# Patient Record
Sex: Female | Born: 1980 | Race: White | Hispanic: No | Smoking: Former smoker
Health system: Southern US, Community
[De-identification: ages and names within clinical notes are randomized; demographics above are authoritative.]

## PROBLEM LIST (undated history)

## (undated) DIAGNOSIS — J939 Pneumothorax, unspecified: Secondary | ICD-10-CM

## (undated) DIAGNOSIS — F32A Depression, unspecified: Secondary | ICD-10-CM

## (undated) DIAGNOSIS — F329 Major depressive disorder, single episode, unspecified: Secondary | ICD-10-CM

## (undated) DIAGNOSIS — E039 Hypothyroidism, unspecified: Secondary | ICD-10-CM

## (undated) DIAGNOSIS — N83209 Unspecified ovarian cyst, unspecified side: Secondary | ICD-10-CM

## (undated) DIAGNOSIS — Z9109 Other allergy status, other than to drugs and biological substances: Secondary | ICD-10-CM

## (undated) DIAGNOSIS — N946 Dysmenorrhea, unspecified: Secondary | ICD-10-CM

## (undated) DIAGNOSIS — K219 Gastro-esophageal reflux disease without esophagitis: Secondary | ICD-10-CM

## (undated) HISTORY — DX: Pneumothorax, unspecified: J93.9

## (undated) HISTORY — PX: TUBAL LIGATION: SHX77

## (undated) HISTORY — DX: Depression, unspecified: F32.A

## (undated) HISTORY — PX: OTHER SURGICAL HISTORY: SHX169

## (undated) HISTORY — DX: Major depressive disorder, single episode, unspecified: F32.9

## (undated) HISTORY — PX: CATARACT EXTRACTION: SUR2

## (undated) HISTORY — PX: CARDIAC VALVE SURGERY: SHX40

---

## 2015-10-08 ENCOUNTER — Emergency Department (HOSPITAL_COMMUNITY)
Admission: EM | Admit: 2015-10-08 | Discharge: 2015-10-08 | Disposition: A | Discharge status: Home / Self Care | Payer: Medicaid Other | Attending: Emergency Medicine | Admitting: Emergency Medicine

## 2015-10-08 ENCOUNTER — Emergency Department (HOSPITAL_COMMUNITY): Payer: Medicaid Other

## 2015-10-08 ENCOUNTER — Encounter (HOSPITAL_COMMUNITY): Payer: Self-pay | Admitting: Emergency Medicine

## 2015-10-08 DIAGNOSIS — Z87891 Personal history of nicotine dependence: Secondary | ICD-10-CM | POA: Diagnosis not present

## 2015-10-08 DIAGNOSIS — R1031 Right lower quadrant pain: Secondary | ICD-10-CM | POA: Diagnosis not present

## 2015-10-08 HISTORY — DX: Unspecified ovarian cyst, unspecified side: N83.209

## 2015-10-08 LAB — URINALYSIS, ROUTINE W REFLEX MICROSCOPIC
BILIRUBIN URINE: NEGATIVE
Glucose, UA: NEGATIVE mg/dL
HGB URINE DIPSTICK: NEGATIVE
Ketones, ur: NEGATIVE mg/dL
Nitrite: NEGATIVE
PH: 6 (ref 5.0–8.0)
Protein, ur: NEGATIVE mg/dL
SPECIFIC GRAVITY, URINE: 1.014 (ref 1.005–1.030)

## 2015-10-08 LAB — PREGNANCY, URINE: Preg Test, Ur: NEGATIVE

## 2015-10-08 LAB — WET PREP, GENITAL
Sperm: NONE SEEN
TRICH WET PREP: NONE SEEN
YEAST WET PREP: NONE SEEN

## 2015-10-08 LAB — URINE MICROSCOPIC-ADD ON

## 2015-10-08 MED ORDER — TRAMADOL HCL 50 MG PO TABS: 50.0000 mg | ORAL_TABLET | Freq: Four times a day (QID) | ORAL | Status: DC | PRN

## 2015-10-08 MED ORDER — ACETAMINOPHEN 500 MG PO TABS
1000.0000 mg | ORAL_TABLET | Freq: Once | ORAL | Status: AC
  Administered 2015-10-08: 1000 mg via ORAL
  Filled 2015-10-08: qty 2

## 2015-10-08 NOTE — ED Provider Notes (Signed)
CSN: [id number]     Arrival date & time 10/08/15  4098 History   First MD Initiated Contact with Patient 10/08/15 [phone]     Chief Complaint  Patient presents with  . Abdominal Pain    HPI  Patient patient presents with concern of right lower quadrant abdominal pain. Pain began possibly 2 weeks ago, has become worse the past few days. Pain is nonradiating, focally in the inguinal crease, sharp, moderate. There is also ongoing pain following intercourse, but no new bleeding, discharge. Patient denies fever, chills. No relief with OTC medication. Patient has a history of ovarian cyst, diagnosed one year ago. Patient recently moved to this area, has no gynecologist here. Patient states that she is otherwise well.   Past Medical History  Diagnosis Date  . Ovarian cyst    Past Surgical History  Procedure Laterality Date  . Leap procedure    . Right foot screws      No family history on file. Social History  Substance Use Topics  . Smoking status: Former Smoker    Quit date: 08/08/2015  . Smokeless tobacco: None  . Alcohol Use: Yes     Comment: occasionally   OB History    No data available     Review of Systems  Constitutional:       Per HPI, otherwise negative  HENT:       Per HPI, otherwise negative  Respiratory:       Per HPI, otherwise negative  Cardiovascular:       Per HPI, otherwise negative  Gastrointestinal: Positive for nausea. Negative for vomiting.  Endocrine:       Negative aside from HPI  Genitourinary:       No dysuria, +cervical pain, intercourse every day.  Musculoskeletal:       Per HPI, otherwise negative  Skin: Negative for color change.  Neurological: Negative for syncope.      Allergies  Ibuprofen  Home Medications   Prior to Admission medications   Medication Sig Start Date End Date Taking? Authorizing Provider  levothyroxine (SYNTHROID, LEVOTHROID) 50 MCG tablet Take 50 mcg by mouth daily before breakfast.   Yes Historical  Provider, MD  Multiple Vitamin (MULTIVITAMIN WITH MINERALS) TABS tablet Take 1 tablet by mouth daily.   Yes Historical Provider, MD   BP 123/82 mmHg  Pulse 70  Temp(Src) 98.6 F (37 C) (Oral)  Ht  (1.549 m)  Wt 125 lb (56.7 kg)  BMI 23.63 kg/m2  SpO2 100%  LMP 09/24/2015 (Approximate) Physical Exam  Constitutional: She is oriented to person, place, and time. She appears well-developed and well-nourished. No distress.  HENT:  Head: Normocephalic and atraumatic.  Eyes: Conjunctivae and EOM are normal.  Cardiovascular: Normal rate and regular rhythm.   Pulmonary/Chest: Effort normal and breath sounds normal. No stridor. No respiratory distress.  Abdominal: She exhibits no distension.    Genitourinary: Uterus is not enlarged. Cervix exhibits discharge. Cervix exhibits no motion tenderness and no friability. Right adnexum displays no mass and no tenderness. Left adnexum displays no mass and no tenderness.    Musculoskeletal: She exhibits no edema.  Neurological: She is alert and oriented to person, place, and time. No cranial nerve deficit.  Skin: Skin is warm and dry.  Psychiatric: She has a normal mood and affect.  Nursing note and vitals reviewed.   ED Course  Pelvic exam Date/Time: 10/08/2015 9:38 AM Performed by: Gerhard Munch Authorized by: Gerhard Munch Consent: Verbal consent obtained. Risks and  benefits: risks, benefits and alternatives were discussed Consent given by: patient Patient understanding: patient states understanding of the procedure being performed Patient consent: the patient's understanding of the procedure matches consent given Procedure consent: procedure consent matches procedure scheduled Relevant documents: relevant documents present and verified Test results: test results available and properly labeled Site marked: the operative site was marked Imaging studies: imaging studies available Required items: required blood products, implants,  devices, and special equipment available Patient identity confirmed: verbally with patient Time out: Immediately prior to procedure a "time out" was called to verify the correct patient, procedure, equipment, support staff and site/side marked as required. Preparation: Patient was prepped and draped in the usual sterile fashion. Local anesthesia used: no Patient sedated: no Patient tolerance: Patient tolerated the procedure well with no immediate complications   (including critical care time) Labs Review Labs Reviewed  URINALYSIS, ROUTINE W REFLEX MICROSCOPIC (NOT AT [facility]) - Abnormal; Notable for the following:    APPearance CLOUDY (*)    Leukocytes, UA TRACE (*)    All other components within normal limits  URINE MICROSCOPIC-ADD ON - Abnormal; Notable for the following:    Squamous Epithelial / LPF 6-30 (*)    Bacteria, UA FEW (*)    All other components within normal limits  PREGNANCY, URINE    Imaging Review No results found. I have personally reviewed and evaluated these images and lab results as part of my medical decision-making.  9:36 AM Following pelvic exam and discussed all findings the patient and her companion. Patient has lab studies pending, will follow up with gynecology.  MDM   Final diagnoses:  RLQ abdominal pain   And female presents with right lower quadrant abdominal pain, vaginal pain. Here she is awake and alert, with no evidence for acute intra-abdominal pathology. Issues pelvic exam does demonstrate some discharge, labs sent, will follow-up with gynecology. Absent acute findings, with stable vital signs, patient was discharged with pain control, to follow-up as an outpatient.  Gerhard Munchobert Kordae Buonocore, MD 10/08/15 [phone]

## 2015-10-08 NOTE — ED Notes (Addendum)
C/o right lower quad abdominal pain that radiates into the back and down the leg.  Pain in abdomen for a couple of days and started into back and down leg yesterday.  Hx of ovarian cyst on that side which was diagnosed in December.  Pain comes and goes after period since December.  Finished cycle 2 weeks ago.  Also reports that cervix hurts.  Hx of cervical dysplasia.

## 2015-10-08 NOTE — Discharge Instructions (Signed)
As discussed, today's evaluation has been largely reassuring. There are several laboratory studies are being conducted. You'll be able to discuss these results with your physician will follow-up this week.  Return to the hospital for concerning changes in your condition. Abdominal Pain, Adult Many things can cause abdominal pain. Usually, abdominal pain is not caused by a disease and will improve without treatment. It can often be observed and treated at home. Your health care provider will do a physical exam and possibly order blood tests and X-rays to help determine the seriousness of your pain. However, in many cases, more time must pass before a clear cause of the pain can be found. Before that point, your health care provider may not know if you need more testing or further treatment. HOME CARE INSTRUCTIONS Monitor your abdominal pain for any changes. The following actions may help to alleviate any discomfort you are experiencing:  Only take over-the-counter or prescription medicines as directed by your health care provider.  Do not take laxatives unless directed to do so by your health care provider.  Try a clear liquid diet (broth, tea, or water) as directed by your health care provider. Slowly move to a bland diet as tolerated. SEEK MEDICAL CARE IF:  You have unexplained abdominal pain.  You have abdominal pain associated with nausea or diarrhea.  You have pain when you urinate or have a bowel movement.  You experience abdominal pain that wakes you in the night.  You have abdominal pain that is worsened or improved by eating food.  You have abdominal pain that is worsened with eating fatty foods.  You have a fever. SEEK IMMEDIATE MEDICAL CARE IF:  Your pain does not go away within 2 hours.  You keep throwing up (vomiting).  Your pain is felt only in portions of the abdomen, such as the right side or the left lower portion of the abdomen.  You pass bloody or black tarry  stools. MAKE SURE YOU:  Understand these instructions.  Will watch your condition.  Will get help right away if you are not doing well or get worse.   This information is not intended to replace advice given to you by your health care provider. Make sure you discuss any questions you have with your health care provider.   Document Released: 12/25/2004 Document Revised: 12/06/2014 Document Reviewed: 11/24/2012 Elsevier Interactive Patient Education [employer]2016 [employer] Inc.

## 2015-10-08 NOTE — ED Notes (Signed)
Patient still off the floor for scan. 

## 2015-10-09 LAB — GC/CHLAMYDIA PROBE AMP (~~LOC~~) NOT AT ARMC
CHLAMYDIA, DNA PROBE: NEGATIVE
Neisseria Gonorrhea: NEGATIVE

## 2015-10-15 ENCOUNTER — Encounter: Payer: Self-pay | Admitting: Obstetrics & Gynecology

## 2015-10-15 ENCOUNTER — Ambulatory Visit (INDEPENDENT_AMBULATORY_CARE_PROVIDER_SITE_OTHER): Payer: Medicaid Other | Admitting: Obstetrics & Gynecology

## 2015-10-15 VITALS — BP 112/80 | HR 74 | Ht 61.0 in | Wt 123.0 lb

## 2015-10-15 DIAGNOSIS — M7918 Myalgia, other site: Secondary | ICD-10-CM

## 2015-10-15 DIAGNOSIS — R1031 Right lower quadrant pain: Secondary | ICD-10-CM | POA: Diagnosis not present

## 2015-10-15 DIAGNOSIS — M791 Myalgia: Secondary | ICD-10-CM

## 2015-10-15 NOTE — Progress Notes (Signed)
Patient ID: Aimee Larsenrystal Brisendine, female   DOB: 08/22/1980, 35 y.o.   MRN: [id number]      Chief Complaint  Patient presents with  . Follow-up    Seem [place]/ lower abdominal pain. ultrasound done    Blood pressure 112/80, pulse 74, height 5\' 1"  (1.549 m), weight 123 lb (55.8 kg), last menstrual period 09/24/2015.  35 y.o. No obstetric history on file. Patient's last menstrual period was 09/24/2015 (approximate). The current method of family planning is none.  Subjective +cornett's sign  Objective Reviewed sonogram normal  Pertinent ROS No burning with urination, frequency or urgency No nausea, vomiting or diarrhea Nor fever chills or other constitutional symptoms   Labs or studies reviewed    Impression Diagnoses this Encounter::   ICD-9-CM ICD-10-CM   1. RLQ abdominal pain 789.03 R10.31   2. Myofascial pain 729.1 M79.1     Established relevant diagnosis(es):   Plan/Recommendations: No orders of the defined types were placed in this encounter.   Labs or Scans Ordered: No orders of the defined types were placed in this encounter.   Management:: Trigger Point Injection   Pre-operative diagnosis: myofascial pain  Post-operative diagnosis: myofascial pain  After risks and benefits were explained including bleeding, infection, worsening of the pain, damage to the area being injected, weakness, allergic reaction to medications, vascular injection, and nerve damage, signed consent was obtained.  All questions were answered.    The area of the trigger point was identified and the skin prepped three times with alcohol and the alcohol allowed to dry.  Next, a 25 gauge 0.5 inch needle was placed in the area of the trigger point.  Once reproduction of the pain was elicited and negative aspiration confirmed, the trigger point was injected and the needle removed.    The patient did tolerate the procedure well and there were not complications.    Medication used:  20 cc  0.5% marcaine plain   Trigger points injected: 2    Trigger point(s) location(s):  right   Follow up No Follow-up on file.         All questions were answered.

## 2015-10-29 ENCOUNTER — Ambulatory Visit (INDEPENDENT_AMBULATORY_CARE_PROVIDER_SITE_OTHER): Payer: Medicaid Other | Admitting: Obstetrics & Gynecology

## 2015-10-29 ENCOUNTER — Encounter: Payer: Self-pay | Admitting: Obstetrics & Gynecology

## 2015-10-29 DIAGNOSIS — M791 Myalgia: Secondary | ICD-10-CM

## 2015-10-29 DIAGNOSIS — M7918 Myalgia, other site: Secondary | ICD-10-CM

## 2015-10-29 DIAGNOSIS — R1031 Right lower quadrant pain: Secondary | ICD-10-CM

## 2015-10-29 NOTE — Progress Notes (Signed)
Trigger Point Injection   Pre-operative diagnosis: myofascial pain, RLQ, rectus abdominus insertion into pubic fascia band (has some triggers in the PSIS area on the right as well)  Post-operative diagnosis: myofascial pain  After risks and benefits were explained including bleeding, infection, worsening of the pain, damage to the area being injected, weakness, allergic reaction to medications, vascular injection, and nerve damage, signed consent was obtained.  All questions were answered.    The area of the trigger point was identified and the skin prepped three times with alcohol and the alcohol allowed to dry.  Next, a 25 gauge 0.5 inch needle was placed in the area of the trigger point.  Once reproduction of the pain was elicited and negative aspiration confirmed, the trigger point was injected and the needle removed.    The patient did tolerate the procedure well and there were not complications.    Medication used:  10 CC 0.5% marcaine along rectus insertion    Trigger points injected: 1    Trigger point(s) location(s):  right   Local care of PSIS reviewed and pt will get a lacrosse ball

## 2015-10-31 ENCOUNTER — Telehealth: Payer: Self-pay | Admitting: Obstetrics & Gynecology

## 2015-10-31 NOTE — Telephone Encounter (Signed)
Pt states saw Dr. Despina Hidden on 10/29/2015 given injection, now having pain inf vaginal area that extends to right abdominal area and back, bruise at injection site.  Pt states hurting worse now than before she got the shots.  Pt advised Dr.Eure will be in the office this pm will route message.

## 2015-11-01 ENCOUNTER — Other Ambulatory Visit: Payer: Self-pay | Admitting: Obstetrics & Gynecology

## 2015-11-01 MED ORDER — TRAMADOL HCL 50 MG PO TABS: 50.0000 mg | ORAL_TABLET | Freq: Two times a day (BID) | ORAL | 0 refills | Status: DC | PRN

## 2015-11-01 NOTE — Telephone Encounter (Signed)
Pt informed Ultram e-scribed to pharmacy and of Dr.Eure's recommendation. Pt verbalized understanding.

## 2015-11-01 NOTE — Telephone Encounter (Signed)
The bruise at the injection site is not unusual, put some ice on it   As far as the pain being worse, she needs to be rolling her back and I will e prescribe ultram    Keep her appointment will probably have to inject her back area  next time as well

## 2015-11-09 ENCOUNTER — Encounter: Payer: Self-pay | Admitting: Obstetrics & Gynecology

## 2015-11-09 ENCOUNTER — Ambulatory Visit (INDEPENDENT_AMBULATORY_CARE_PROVIDER_SITE_OTHER): Payer: Medicaid Other | Admitting: Obstetrics & Gynecology

## 2015-11-09 DIAGNOSIS — R102 Pelvic and perineal pain: Secondary | ICD-10-CM

## 2015-11-09 DIAGNOSIS — G90521 Complex regional pain syndrome I of right lower limb: Secondary | ICD-10-CM | POA: Insufficient documentation

## 2015-11-09 NOTE — Patient Instructions (Signed)
  Place pelvic pain patient instructions here.  

## 2015-11-09 NOTE — Progress Notes (Signed)
Subjective:     Aimee LarsenCrystal Price is a 35 y.o. female who presents for evaluation of abdominal pain. The pain is described as shooting and tingling. Pain is located in the RLQ and groin and right lower back and radiates down her thigh area with radiation to the thigh. Onset was sudden occurring a few weeks ago. Symptoms have been gradually worsening since. Aggravating factors: activity. Alleviating factors: some relief after trigger point injection by Dr. Despina HiddenEure. Associated symptoms: can occur anytime in her menstrual cycle. The patient denies anorexia, constipation, fever and nausea. Risk factors for pelvic/abdominal pain include prior ovarian cyst rupture.  Menstrual History: OB History    No data available     G4P4  Patient's last menstrual period was 10/22/2015.    The following portions of the patient's history were reviewed and updated as appropriate: allergies, current medications, past family history, past medical history, past social history, past surgical history and problem list.   Review of Systems Pertinent items are noted in HPI.    Objective:    BP 128/88 (BP Location: Left Arm, Patient Position: Sitting, Cuff Size: Normal)   Pulse 96   Wt 121 lb 1.6 oz (54.9 kg)   LMP 10/22/2015   BMI 22.88 kg/m  General:   alert, cooperative and no distress  Lungs:      Heart:      Abdomen:  soft, non-tender; bowel sounds normal; no masses,  no organomegaly and bruise in RLQ  CVA:   absent  Pelvis:  Vaginal: normal mucosa without prolapse or lesions Adnexa: normal bimanual exam Uterus: normal single, nontender tender over RLQ and pubis  Extremities:   extremities normal, atraumatic, no cyanosis or edema  Neurologic:   negative  Psychiatric:   normal mood, behavior, speech, dress, and thought processes   Lab Review   Imaging Ultrasound - Pelvic Vaginal was normal   Assessment:    Functional: MS pain     Plan:    The diagnosis was discussed with the patient and evaluation  and treatment plans outlined. F/U with Dr Despina HiddenEure and possible chiropractic  Adam PhenixJames G Arnold, MD

## 2015-11-12 ENCOUNTER — Ambulatory Visit (INDEPENDENT_AMBULATORY_CARE_PROVIDER_SITE_OTHER): Payer: Medicaid Other | Admitting: Obstetrics & Gynecology

## 2015-11-12 ENCOUNTER — Encounter: Payer: Self-pay | Admitting: Obstetrics & Gynecology

## 2015-11-12 VITALS — BP 102/68 | HR 68 | Ht 61.0 in | Wt 122.5 lb

## 2015-11-12 DIAGNOSIS — M791 Myalgia: Secondary | ICD-10-CM | POA: Diagnosis not present

## 2015-11-12 DIAGNOSIS — M7918 Myalgia, other site: Secondary | ICD-10-CM

## 2015-11-12 DIAGNOSIS — R1031 Right lower quadrant pain: Secondary | ICD-10-CM | POA: Diagnosis not present

## 2015-11-12 MED ORDER — CYCLOBENZAPRINE HCL 10 MG PO TABS: 10.0000 mg | ORAL_TABLET | Freq: Three times a day (TID) | ORAL | 1 refills | Status: DC | PRN

## 2015-11-12 NOTE — Progress Notes (Signed)
Trigger Point Injection   Pre-operative diagnosis: Right posterior superior initial spine pain as primary source for right lower quadrant pain, right lower quadrant pain secondary  Post-operative diagnosis: same  After risks and benefits were explained including bleeding, infection, worsening of the pain, damage to the area being injected, weakness, allergic reaction to medications, vascular injection, and nerve damage, signed consent was obtained.  All questions were answered.    The area of the trigger point was identified and the skin prepped three times with alcohol and the alcohol allowed to dry.  Next, a 25 gauge 0.5 inch needle was placed in the area of the trigger point.  Once reproduction of the pain was elicited and negative aspiration confirmed, the trigger point was injected and the needle removed.    The patient did tolerate the procedure well and there were not complications.    Medication used: 10 cc 0.5% marcaine  Trigger points injected: 1     Taking ultram prn which is "helping a lot" Meds ordered this encounter  Medications  . cyclobenzaprine (FLEXERIL) 10 MG tablet    Sig: Take 1 tablet (10 mg total) by mouth every 8 (eight) hours as needed for muscle spasms.    Dispense:  30 tablet    Refill:  1    Trigger point(s) location(s):  right

## 2015-12-10 ENCOUNTER — Ambulatory Visit: Payer: Medicaid Other | Admitting: Obstetrics & Gynecology

## 2015-12-11 ENCOUNTER — Ambulatory Visit: Payer: Medicaid Other | Admitting: Obstetrics & Gynecology

## 2015-12-25 ENCOUNTER — Encounter: Payer: Self-pay | Admitting: Obstetrics & Gynecology

## 2015-12-25 ENCOUNTER — Ambulatory Visit (INDEPENDENT_AMBULATORY_CARE_PROVIDER_SITE_OTHER): Payer: Medicaid Other | Admitting: Obstetrics & Gynecology

## 2015-12-25 VITALS — BP 110/60 | HR 60 | Wt 126.0 lb

## 2015-12-25 DIAGNOSIS — N946 Dysmenorrhea, unspecified: Secondary | ICD-10-CM

## 2015-12-25 DIAGNOSIS — M7918 Myalgia, other site: Secondary | ICD-10-CM

## 2015-12-25 MED ORDER — DESOGESTREL-ETHINYL ESTRADIOL 0.15-0.02/0.01 MG (21/5) PO TABS: 1.0000 | ORAL_TABLET | Freq: Every day | ORAL | 11 refills | Status: DC

## 2015-12-25 NOTE — Progress Notes (Signed)
Chief Complaint  Patient presents with  . Follow-up    myofiscial pain    Blood pressure 110/60, pulse 60, weight 126 lb (57.2 kg), last menstrual period 12/13/2015.  35 y.o. G4P0 Patient's last menstrual period was 12/13/2015. The current method of family planning is OCP (estrogen/progesterone).  Outpatient Encounter Prescriptions as of 12/25/2015  Medication Sig  . levothyroxine (SYNTHROID, LEVOTHROID) 50 MCG tablet Take 150 mcg by mouth daily before breakfast.   . Multiple Vitamin (MULTIVITAMIN WITH MINERALS) TABS tablet Take 1 tablet by mouth daily.  . cyclobenzaprine (FLEXERIL) 10 MG tablet Take 1 tablet (10 mg total) by mouth every 8 (eight) hours as needed for muscle spasms. (Patient not taking: Reported on 12/25/2015)  . desogestrel-ethinyl estradiol (KARIVA,AZURETTE,MIRCETTE) 0.15-0.02/0.01 MG (21/5) tablet Take 1 tablet by mouth daily.  . traMADol (ULTRAM) 50 MG tablet Take 1 tablet (50 mg total) by mouth every 12 (twelve) hours as needed. (Patient not taking: Reported on 12/25/2015)   No facility-administered encounter medications on file as of 12/25/2015.     Subjective Pt states her right back pain and right lower quadrant pain is essentially resolved since her injections and stopping her heavy repetitive lifting at work  Objective No PSIS pain NO RLQ pain  Pertinent ROS No burning with urination, frequency or urgency No nausea, vomiting or diarrhea Nor fever chills or other constitutional symptoms   Labs or studies none    Impression Diagnoses this Encounter::   ICD-9-CM ICD-10-CM   1. Myofascial pain 729.1 M79.1   2. Dysmenorrhea 625.3 N94.6     Established relevant diagnosis(es):   Plan/Recommendations: Meds ordered this encounter  Medications  . desogestrel-ethinyl estradiol (KARIVA,AZURETTE,MIRCETTE) 0.15-0.02/0.01 MG (21/5) tablet    Sig: Take 1 tablet by mouth daily.    Dispense:  1 Package    Refill:  11    Labs or Scans  Ordered: No orders of the defined types were placed in this encounter.   Management:: Begin OCP for her menstrual pain, NSAID as well if needed  Follow up Return if symptoms worsen or fail to improve.     .   All questions were answered.  Past Medical History:  Diagnosis Date  . Depression   . Ovarian cyst   . Pneumothorax     Past Surgical History:  Procedure Laterality Date  . CARDIAC VALVE SURGERY    . CATARACT EXTRACTION    . leap procedure    . lung procedure     procedure for collapsed lung  . right foot screws     . tubal ligations      OB History    Gravida Para Term Preterm AB Living   4             SAB TAB Ectopic Multiple Live Births                  Allergies  Allergen Reactions  . Bee Venom Anaphylaxis    Pt states was a small child and can not remember allergy reaction  . Ibuprofen Hives    Social History   Social History  . Marital status: Single    Spouse name: N/A  . Number of children: N/A  . Years of education: N/A   Social History Main Topics  . Smoking status: Former Smoker    Quit date: 08/08/2015  . Smokeless tobacco: Current User    Types: Chew  . Alcohol use Yes     Comment: occasionally  .  Drug use: No  . Sexual activity: Yes    Birth control/ protection: Surgical   Other Topics Concern  . None   Social History Narrative  . None    Family History  Problem Relation Age of Onset  . Adopted: Yes  . Family history unknown: Yes

## 2016-01-22 ENCOUNTER — Encounter (HOSPITAL_COMMUNITY): Payer: Self-pay | Admitting: Emergency Medicine

## 2016-01-22 DIAGNOSIS — Z87891 Personal history of nicotine dependence: Secondary | ICD-10-CM | POA: Diagnosis not present

## 2016-01-22 DIAGNOSIS — N946 Dysmenorrhea, unspecified: Secondary | ICD-10-CM | POA: Diagnosis not present

## 2016-01-22 LAB — CBC WITH DIFFERENTIAL/PLATELET
BASOS PCT: 0 %
Basophils Absolute: 0 10*3/uL (ref 0.0–0.1)
Eosinophils Absolute: 0.1 10*3/uL (ref 0.0–0.7)
Eosinophils Relative: 1 %
HEMATOCRIT: 40.8 % (ref 36.0–46.0)
HEMOGLOBIN: 14.2 g/dL (ref 12.0–15.0)
LYMPHS ABS: 2.7 10*3/uL (ref 0.7–4.0)
LYMPHS PCT: 24 %
MCH: 31.2 pg (ref 26.0–34.0)
MCHC: 34.8 g/dL (ref 30.0–36.0)
MCV: 89.7 fL (ref 78.0–100.0)
MONOS PCT: 6 %
Monocytes Absolute: 0.7 10*3/uL (ref 0.1–1.0)
NEUTROS ABS: 7.7 10*3/uL (ref 1.7–7.7)
NEUTROS PCT: 69 %
Platelets: 214 10*3/uL (ref 150–400)
RBC: 4.55 MIL/uL (ref 3.87–5.11)
RDW: 12.5 % (ref 11.5–15.5)
WBC: 11.3 10*3/uL — ABNORMAL HIGH (ref 4.0–10.5)

## 2016-01-22 LAB — URINE MICROSCOPIC-ADD ON

## 2016-01-22 LAB — URINALYSIS, ROUTINE W REFLEX MICROSCOPIC
Bilirubin Urine: NEGATIVE
GLUCOSE, UA: NEGATIVE mg/dL
KETONES UR: NEGATIVE mg/dL
LEUKOCYTES UA: NEGATIVE
Nitrite: NEGATIVE
PROTEIN: 100 mg/dL — AB
Specific Gravity, Urine: 1.024 (ref 1.005–1.030)
pH: 6 (ref 5.0–8.0)

## 2016-01-22 LAB — COMPREHENSIVE METABOLIC PANEL
ALBUMIN: 4 g/dL (ref 3.5–5.0)
ALK PHOS: 46 U/L (ref 38–126)
ALT: 19 U/L (ref 14–54)
ANION GAP: 7 (ref 5–15)
AST: 20 U/L (ref 15–41)
BILIRUBIN TOTAL: 0.4 mg/dL (ref 0.3–1.2)
BUN: 8 mg/dL (ref 6–20)
CALCIUM: 9.5 mg/dL (ref 8.9–10.3)
CO2: 25 mmol/L (ref 22–32)
Chloride: 107 mmol/L (ref 101–111)
Creatinine, Ser: 0.94 mg/dL (ref 0.44–1.00)
GFR calc Af Amer: >60 mL/min (ref >60)
GLUCOSE: 108 mg/dL — AB (ref 65–99)
Potassium: 3.4 mmol/L — ABNORMAL LOW (ref 3.5–5.1)
Sodium: 139 mmol/L (ref 135–145)
TOTAL PROTEIN: 6.9 g/dL (ref 6.5–8.1)

## 2016-01-22 LAB — POC URINE PREG, ED: Preg Test, Ur: NEGATIVE

## 2016-01-22 NOTE — ED Triage Notes (Signed)
Patient reports painful /cramping at lower abdomen today unrelieved by Midol  , pt. stated it is her 1st day of menstruation with spotting , denies emesis or fever .

## 2016-01-23 ENCOUNTER — Emergency Department (HOSPITAL_COMMUNITY)
Admission: EM | Admit: 2016-01-23 | Discharge: 2016-01-23 | Disposition: A | Discharge status: Home / Self Care | Payer: Medicaid Other | Attending: Emergency Medicine | Admitting: Emergency Medicine

## 2016-01-23 DIAGNOSIS — N946 Dysmenorrhea, unspecified: Secondary | ICD-10-CM

## 2016-01-23 MED ORDER — NAPROXEN 500 MG PO TABS: 500.0000 mg | ORAL_TABLET | Freq: Two times a day (BID) | ORAL | 0 refills | Status: DC

## 2016-01-23 NOTE — Discharge Instructions (Signed)
Take your medication as prescribed as needed for pain relief. Continue taking your birth control as prescribed. I recommend follow up with your OBGYN in the next week if your symptoms have not improved.  Please return to the Emergency Department if symptoms worsen or new onset of fever, abdominal pain, vomiting, chest pain, difficulty breathing, blood in urine or stool, lightheadedness, syncope, vaginal discharge.

## 2016-01-23 NOTE — ED Provider Notes (Signed)
[facility]-EMERGENCY DEPT Provider Note   CSN: [id number] Arrival date & time: 01/22/16  2137     History   Chief Complaint Chief Complaint  Patient presents with  . Dysmenorrhea    HPI Aimee Price is a 35 y.o. female.  Patient is a 35 year old female with history of ovarian cysts who presents to the ED with complaint of pelvic cramping, onset 7 PM. Patient reports starting her period earlier this evening and reports having spotting. Patient reports having intermittent episodes of severe pelvic cramping which she states radiates around to her back. Denies any aggravating or alleviating factors. She reports taking Midol at home without relief. Denies fever, chills, chest pain, difficulty breathing, abdominal pain, nausea, vomiting, diarrhea, urinary symptoms, vaginal discharge. Patient notes her last menstrual cycle was approximately 6 weeks ago but notes she was started on birth control 3.5 weeks ago for her ovarian cysts by her OBGYN. She notes she was scheduled to start her menstrual cycle this week. Patient reports having similar cramping related to her menstrual cycles in the past but notes her pain tonight was more severe. Denies any pain since arrival to the ED. Endorses history of tubal ligation and LEEP procedure. [id number].  OBGYN- Dr. Despina HiddenEure      Past Medical History:  Diagnosis Date  . Depression   . Ovarian cyst   . Pneumothorax     Patient Active Problem List   Diagnosis Date Noted  . Complex regional pain syndrome type 1 affecting right pelvic region and thigh 11/09/2015    Past Surgical History:  Procedure Laterality Date  . CARDIAC VALVE SURGERY    . CATARACT EXTRACTION    . leap procedure    . lung procedure     procedure for collapsed lung  . right foot screws     . tubal ligations      OB History    Gravida Para Term Preterm AB Living   4             SAB TAB Ectopic Multiple Live Births                   Home Medications    Prior to Admission  medications   Medication Sig Start Date End Date Taking? Authorizing Provider  desogestrel-ethinyl estradiol (KARIVA,AZURETTE,MIRCETTE) 0.15-0.02/0.01 MG (21/5) tablet Take 1 tablet by mouth daily. 12/25/15  Yes Lazaro ArmsLuther H Eure, MD  levothyroxine (SYNTHROID, LEVOTHROID) 50 MCG tablet Take 150 mcg by mouth daily before breakfast.    Yes Historical Provider, MD  Multiple Vitamin (MULTIVITAMIN WITH MINERALS) TABS tablet Take 1 tablet by mouth daily.   Yes Historical Provider, MD  traMADol (ULTRAM) 50 MG tablet Take 1 tablet (50 mg total) by mouth every 12 (twelve) hours as needed. Patient taking differently: Take 50 mg by mouth every 12 (twelve) hours as needed for moderate pain.  11/01/15  Yes Lazaro ArmsLuther H Eure, MD  cyclobenzaprine (FLEXERIL) 10 MG tablet Take 1 tablet (10 mg total) by mouth every 8 (eight) hours as needed for muscle spasms. Patient not taking: Reported on 01/22/2016 11/12/15   Lazaro ArmsLuther H Eure, MD  naproxen (NAPROSYN) 500 MG tablet Take 1 tablet (500 mg total) by mouth 2 (two) times daily. 01/23/16   Barrett HenleNicole Elizabeth Jayme Mednick, PA-C    Family History Family History  Problem Relation Age of Onset  . Adopted: Yes  . Family history unknown: Yes    Social History Social History  Substance Use Topics  . Smoking status: Former Smoker  Quit date: 08/08/2015  . Smokeless tobacco: Current User    Types: Chew  . Alcohol use Yes     Comment: occasionally     Allergies   Bee venom and Ibuprofen   Review of Systems Review of Systems  Genitourinary: Positive for pelvic pain (cramping) and vaginal bleeding.  All other systems reviewed and are negative.    Physical Exam Updated Vital Signs BP 114/82 (BP Location: Left Arm)   Pulse (!) 56   Temp 98 F (36.7 C) (Oral)   Resp 20   Ht 5\' 1"  (1.549 m)   Wt 59 kg   LMP 01/22/2016   SpO2 99%   BMI 24.56 kg/m   Physical Exam  Constitutional: She is oriented to person, place, and time. She appears well-developed and well-nourished.  No distress.  HENT:  Head: Normocephalic and atraumatic.  Mouth/Throat: Uvula is midline, oropharynx is clear and moist and mucous membranes are normal. No oropharyngeal exudate, posterior oropharyngeal edema, posterior oropharyngeal erythema or tonsillar abscesses. No tonsillar exudate.  Eyes: Conjunctivae and EOM are normal. Right eye exhibits no discharge. Left eye exhibits no discharge. No scleral icterus.  Neck: Normal range of motion. Neck supple.  Cardiovascular: Normal rate, regular rhythm, normal heart sounds and intact distal pulses.   Pulmonary/Chest: Effort normal and breath sounds normal. No respiratory distress. She has no wheezes. She has no rales. She exhibits no tenderness.  Abdominal: Soft. Bowel sounds are normal. She exhibits no distension and no mass. There is no tenderness. There is no rebound and no guarding. No hernia.  No CVA tenderness  Musculoskeletal: Normal range of motion. She exhibits no edema.  Neurological: She is alert and oriented to person, place, and time.  Skin: Skin is warm and dry. She is not diaphoretic.  Nursing note and vitals reviewed.    ED Treatments / Results  Labs (all labs ordered are listed, but only abnormal results are displayed) Labs Reviewed  CBC WITH DIFFERENTIAL/PLATELET - Abnormal; Notable for the following:       Result Value   WBC 11.3 (*)    All other components within normal limits  COMPREHENSIVE METABOLIC PANEL - Abnormal; Notable for the following:    Potassium 3.4 (*)    Glucose, Bld 108 (*)    All other components within normal limits  URINALYSIS, ROUTINE W REFLEX MICROSCOPIC (NOT AT [facility]) - Abnormal; Notable for the following:    Color, Urine RED (*)    APPearance TURBID (*)    Hgb urine dipstick LARGE (*)    Protein, ur 100 (*)    All other components within normal limits  URINE MICROSCOPIC-ADD ON - Abnormal; Notable for the following:    Squamous Epithelial / LPF 0-5 (*)    Bacteria, UA RARE (*)    All other  components within normal limits  POC URINE PREG, ED    EKG  EKG Interpretation None       Radiology No results found.  Procedures Procedures (including critical care time)  Medications Ordered in ED Medications - No data to display   Initial Impression / Assessment and Plan / ED Course  I have reviewed the triage vital signs and the nursing notes.  Pertinent labs & imaging results that were available during my care of the patient were reviewed by me and considered in my medical decision making (see chart for details).  Clinical Course    Pt presents with pelvic cramping associated with starting her period earlier this evening. Reports  normal vaginal bleeding associated with her menstrual cycle. No relief with Midol. Denies fever, abdominal pain, vaginal discharge. VSS. Exam bening, no abdominal tenderness. Pt denies any pelvic cramping since arrival to the ED and declined any pain medications. Pregnancy negative. UA positive ofr hgb, no signs of infection. Hgb 14.2. Suspect pt's sxs are likely due to dysmenorrhea. I do not suspect ectopic pregnancy, appendicitis, SBO, or other acute surgical abdomen at this time. Discussed results and plan for d/c with pt. Plan to d/c pt home with Naproxen and advised to follow up with her OBGYN in 1 week. Discussed strict return precautions.   Final Clinical Impressions(s) / ED Diagnoses   Final diagnoses:  Dysmenorrhea    New Prescriptions New Prescriptions   NAPROXEN (NAPROSYN) 500 MG TABLET    Take 1 tablet (500 mg total) by mouth 2 (two) times daily.     Satira Sark [place], [place] 01/23/16 1610    Tomasita Crumble, MD 01/23/16 [phone]

## 2016-02-04 ENCOUNTER — Ambulatory Visit (INDEPENDENT_AMBULATORY_CARE_PROVIDER_SITE_OTHER): Payer: Medicaid Other | Admitting: Obstetrics & Gynecology

## 2016-02-04 ENCOUNTER — Encounter: Payer: Self-pay | Admitting: Obstetrics & Gynecology

## 2016-02-04 VITALS — BP 118/68 | HR 72 | Ht 61.0 in | Wt 127.6 lb

## 2016-02-04 DIAGNOSIS — N9412 Deep dyspareunia: Secondary | ICD-10-CM | POA: Diagnosis not present

## 2016-02-04 DIAGNOSIS — N946 Dysmenorrhea, unspecified: Secondary | ICD-10-CM

## 2016-02-04 DIAGNOSIS — N920 Excessive and frequent menstruation with regular cycle: Secondary | ICD-10-CM | POA: Diagnosis not present

## 2016-02-04 MED ORDER — MEGESTROL ACETATE 40 MG PO TABS: 40.0000 mg | ORAL_TABLET | Freq: Every day | ORAL | 11 refills | Status: DC

## 2016-02-04 NOTE — Progress Notes (Signed)
Chief Complaint  Patient presents with  . severe pain with menstrual cycle    Blood pressure 118/68, pulse 72, height 5\' 1"  (1.549 m), weight 127 lb 9.6 oz (57.9 kg), last menstrual period 01/22/2016.  35 y.o. G4P0 Patient's last menstrual period was 01/22/2016. The current method of family planning is tubal ligation and OCP (estrogen/progesterone).  Outpatient Encounter Prescriptions as of 02/04/2016  Medication Sig  . desogestrel-ethinyl estradiol (KARIVA,AZURETTE,MIRCETTE) 0.15-0.02/0.01 MG (21/5) tablet Take 1 tablet by mouth daily.  Marland Kitchen. levothyroxine (SYNTHROID, LEVOTHROID) 50 MCG tablet Take 150 mcg by mouth daily before breakfast.   . Multiple Vitamin (MULTIVITAMIN WITH MINERALS) TABS tablet Take 1 tablet by mouth daily.  . cyclobenzaprine (FLEXERIL) 10 MG tablet Take 1 tablet (10 mg total) by mouth every 8 (eight) hours as needed for muscle spasms. (Patient not taking: Reported on 02/04/2016)  . megestrol (MEGACE) 40 MG tablet Take 1 tablet (40 mg total) by mouth daily.  . naproxen (NAPROSYN) 500 MG tablet Take 1 tablet (500 mg total) by mouth 2 (two) times daily. (Patient not taking: Reported on 02/04/2016)  . traMADol (ULTRAM) 50 MG tablet Take 1 tablet (50 mg total) by mouth every 12 (twelve) hours as needed. (Patient not taking: Reported on 02/04/2016)   No facility-administered encounter medications on file as of 02/04/2016.     Subjective Pt with "worst period ever", usually has bad cramps and heavy bleeding but never like this She felt like she was having a baby The bleeding was heavier than ever She has 50% dyspareunia, interrupts, not positional  Mom had endometriosis  Objective General WDWN female NAD Vulva:  normal appearing vulva with no masses, tenderness or lesions Vagina:  normal mucosa, no discharge Cervix:  no lesions and retroverted Uterus:  normal size, contour, position, consistency, mobility, non-tender and retroverted Adnexa: ovaries:present,   normal adnexa in size, nontender and no masses  Pt has tenderness of the uterosacral ligaments and nodularity  Pertinent ROS No burning with urination, frequency or urgency No nausea, vomiting or diarrhea Nor fever chills or other constitutional symptoms   Labs or studies      Impression Diagnoses this Encounter::   ICD-9-CM ICD-10-CM   1. Dysmenorrhea 625.3 N94.6   2. Menorrhagia with regular cycle 626.2 N92.0   3. Deep dyspareunia 625.0 N94.12     Established relevant diagnosis(es):   Plan/Recommendations: Meds ordered this encounter  Medications  . megestrol (MEGACE) 40 MG tablet    Sig: Take 1 tablet (40 mg total) by mouth daily.    Dispense:  30 tablet    Refill:  11    Labs or Scans Ordered: No orders of the defined types were placed in this encounter.   Management:: Meds ordered this encounter  Medications  . megestrol (MEGACE) 40 MG tablet    Sig: Take 1 tablet (40 mg total) by mouth daily.    Dispense:  30 tablet    Refill:  11      Follow up Return in about 2 months (around 04/05/2016) for Follow up, with Dr Despina HiddenEure.       All questions were answered.  Past Medical History:  Diagnosis Date  . Depression   . Ovarian cyst   . Pneumothorax     Past Surgical History:  Procedure Laterality Date  . CARDIAC VALVE SURGERY    . CATARACT EXTRACTION    . leap procedure    . lung procedure     procedure for collapsed lung  .  right foot screws     . tubal ligations      OB History    Gravida Para Term Preterm AB Living   4             SAB TAB Ectopic Multiple Live Births                  Allergies  Allergen Reactions  . Bee Venom Anaphylaxis    Pt states was a small child and can not remember allergy reaction  . Ibuprofen Hives    Social History   Social History  . Marital status: Single    Spouse name: N/A  . Number of children: N/A  . Years of education: N/A   Social History Main Topics  . Smoking status: Former Smoker     Quit date: 08/08/2015  . Smokeless tobacco: Current User    Types: Chew  . Alcohol use Yes     Comment: occasionally  . Drug use: No  . Sexual activity: Yes    Birth control/ protection: Surgical   Other Topics Concern  . None   Social History Narrative  . None    Family History  Problem Relation Age of Onset  . Adopted: Yes  . Endometriosis Mother

## 2016-03-25 ENCOUNTER — Encounter: Payer: Self-pay | Admitting: Obstetrics & Gynecology

## 2016-03-26 ENCOUNTER — Other Ambulatory Visit: Payer: Self-pay | Admitting: Obstetrics & Gynecology

## 2016-03-26 MED ORDER — OMEPRAZOLE 20 MG PO CPDR: 20.0000 mg | DELAYED_RELEASE_CAPSULE | Freq: Every day | ORAL | 6 refills | Status: AC

## 2016-04-10 ENCOUNTER — Ambulatory Visit (INDEPENDENT_AMBULATORY_CARE_PROVIDER_SITE_OTHER): Payer: Medicaid Other | Admitting: Obstetrics & Gynecology

## 2016-04-10 ENCOUNTER — Encounter: Payer: Self-pay | Admitting: Obstetrics & Gynecology

## 2016-04-10 VITALS — BP 108/78 | HR 79 | Wt 143.0 lb

## 2016-04-10 DIAGNOSIS — N946 Dysmenorrhea, unspecified: Secondary | ICD-10-CM | POA: Diagnosis not present

## 2016-04-10 DIAGNOSIS — N9412 Deep dyspareunia: Secondary | ICD-10-CM | POA: Diagnosis not present

## 2016-04-10 DIAGNOSIS — N920 Excessive and frequent menstruation with regular cycle: Secondary | ICD-10-CM

## 2016-05-11 NOTE — Progress Notes (Signed)
Preoperative History and Physical  Marcelle Geil is a 36 y.o. G4P0 with No LMP recorded. admitted for a TVH due to dysmenorrhea bump dyspareunia and chronic cramping.  Despite stopping her bleeding she has ontinue to have cramping and intercourse pain Not lateral, if normal will preserve ovaries Sonogram is normal   PMH:    Past Medical History:  Diagnosis Date  . Depression   . Ovarian cyst   . Pneumothorax     PSH:     Past Surgical History:  Procedure Laterality Date  . CARDIAC VALVE SURGERY    . CATARACT EXTRACTION    . leap procedure    . lung procedure     procedure for collapsed lung  . right foot screws     . tubal ligations      POb/GynH:      OB History    Gravida Para Term Preterm AB Living   4             SAB TAB Ectopic Multiple Live Births                  SH:   Social History  Substance Use Topics  . Smoking status: Former Smoker    Quit date: 08/08/2015  . Smokeless tobacco: Current User    Types: Chew  . Alcohol use Yes     Comment: occasionally    FH:    Family History  Problem Relation Age of Onset  . Adopted: Yes  . Endometriosis Mother      Allergies:  Allergies  Allergen Reactions  . Bee Venom Anaphylaxis    Pt states was a small child and can not remember allergy reaction  . Ibuprofen Hives    Medications:       Current Outpatient Prescriptions:  .  levothyroxine (SYNTHROID, LEVOTHROID) 50 MCG tablet, Take 150 mcg by mouth daily before breakfast. , Disp: , Rfl:  .  megestrol (MEGACE) 40 MG tablet, Take 1 tablet (40 mg total) by mouth daily., Disp: 30 tablet, Rfl: 11 .  Multiple Vitamin (MULTIVITAMIN WITH MINERALS) TABS tablet, Take 1 tablet by mouth daily., Disp: , Rfl:  .  omeprazole (PRILOSEC) 20 MG capsule, Take 1 capsule (20 mg total) by mouth daily. 1 tablet a day, Disp: 30 capsule, Rfl: 6  Review of Systems:   Review of Systems  Constitutional: Negative for fever, chills, weight loss, malaise/fatigue and diaphoresis.   HENT: Negative for hearing loss, ear pain, nosebleeds, congestion, sore throat, neck pain, tinnitus and ear discharge.   Eyes: Negative for blurred vision, double vision, photophobia, pain, discharge and redness.  Respiratory: Negative for cough, hemoptysis, sputum production, shortness of breath, wheezing and stridor.   Cardiovascular: Negative for chest pain, palpitations, orthopnea, claudication, leg swelling and PND.  Gastrointestinal: Positive for abdominal pain. Negative for heartburn, nausea, vomiting, diarrhea, constipation, blood in stool and melena.  Genitourinary: Negative for dysuria, urgency, frequency, hematuria and flank pain.  Musculoskeletal: Negative for myalgias, back pain, joint pain and falls.  Skin: Negative for itching and rash.  Neurological: Negative for dizziness, tingling, tremors, sensory change, speech change, focal weakness, seizures, loss of consciousness, weakness and headaches.  Endo/Heme/Allergies: Negative for environmental allergies and polydipsia. Does not bruise/bleed easily.  Psychiatric/Behavioral: Negative for depression, suicidal ideas, hallucinations, memory loss and substance abuse. The patient is not nervous/anxious and does not have insomnia.      PHYSICAL EXAM:  Blood pressure 108/78, pulse 79, weight 143 lb (64.9 kg).  Vitals reviewed. Constitutional: She is oriented to person, place, and time. She appears well-developed and well-nourished.  HENT:  Head: Normocephalic and atraumatic.  Right Ear: External ear normal.  Left Ear: External ear normal.  Nose: Nose normal.  Mouth/Throat: Oropharynx is clear and moist.  Eyes: Conjunctivae and EOM are normal. Pupils are equal, round, and reactive to light. Right eye exhibits no discharge. Left eye exhibits no discharge. No scleral icterus.  Neck: Normal range of motion. Neck supple. No tracheal deviation present. No thyromegaly present.  Cardiovascular: Normal rate, regular rhythm, normal heart  sounds and intact distal pulses.  Exam reveals no gallop and no friction rub.   No murmur heard. Respiratory: Effort normal and breath sounds normal. No respiratory distress. She has no wheezes. She has no rales. She exhibits no tenderness.  GI: Soft. Bowel sounds are normal. She exhibits no distension and no mass. There is tenderness. There is no rebound and no guarding.  Genitourinary:       Vulva is normal without lesions Vagina is pink moist without discharge Cervix normal in appearance and pap is normal Uterus is NSSC tender Adnexa is negative with normal sized ovaries by sonogram  Musculoskeletal: Normal range of motion. She exhibits no edema and no tenderness.  Neurological: She is alert and oriented to person, place, and time. She has normal reflexes. She displays normal reflexes. No cranial nerve deficit. She exhibits normal muscle tone. Coordination normal.  Skin: Skin is warm and dry. No rash noted. No erythema. No pallor.  Psychiatric: She has a normal mood and affect. Her behavior is normal. Judgment and thought content normal.    Labs: No results found for this or any previous visit (from the past 336 hour(s)).  EKG: No orders found for this or any previous visit.  Imaging Studies: No results found.    Assessment: Dysmenorrhea  Menorrhagia with regular cycle  Deep dyspareunia    Plan: TVH, preserve ovaries unless there is some unexpected issue  Pt understands the risks of surgery including but not limited t  excessive bleeding requiring transfusion or reoperation, post-operative infection requiring prolonged hospitalization or re-hospitalization and antibiotic therapy, and damage to other organs including bladder, bowel, ureters and major vessels.  The patient also understands the alternative treatment options which were discussed in full.  All questions were answered.  Lynden Flemmer H   Laquisha Northcraft H 8:02 PM

## 2016-05-21 ENCOUNTER — Encounter: Payer: Self-pay | Admitting: Obstetrics & Gynecology

## 2016-06-11 ENCOUNTER — Other Ambulatory Visit: Payer: Self-pay | Admitting: Obstetrics & Gynecology

## 2016-06-11 ENCOUNTER — Encounter: Payer: Self-pay | Admitting: Obstetrics & Gynecology

## 2016-06-16 NOTE — Patient Instructions (Signed)
Aimee Price  06/16/2016     @PREFPERIOPPHARMACY @   Your procedure is scheduled on  06/25/2016  Report to [facility] at  700  A.M.  Call this number if you have problems the morning of surgery:  [phone]   Remember:  Do not eat food or drink liquids after midnight.  Take these medicines the morning of surgery with A SIP OF WATER  Synthroid, prilosec.   Do not wear jewelry, make-up or nail polish.  Do not wear lotions, powders, or perfumes, or deoderant.  Do not shave 48 hours prior to surgery.  Men may shave face and neck.  Do not bring valuables to the hospital.  [facility] is not responsible for any belongings or valuables.  Contacts, dentures or bridgework may not be worn into surgery.  Leave your suitcase in the car.  After surgery it may be brought to your room.  For patients admitted to the hospital, discharge time will be determined by your treatment team.  Patients discharged the day of surgery will not be allowed to drive home.   Name and phone number of your driver:   family Special instructions:  none  Please read over the following fact sheets that you were given. Anesthesia Post-op Instructions and Care and Recovery After Surgery       Vaginal Hysterectomy A vaginal hysterectomy is a procedure to remove all or part of the uterus through a small incision in the vagina. In this procedure, your health care provider may remove your entire uterus, including the lower end (cervix). You may need a vaginal hysterectomy to treat:  Uterine fibroids.  A condition that causes the lining of the uterus to grow in other areas (endometriosis).  Problems with pelvic support.  Cancer of the cervix, ovaries, uterus, or tissue that lines the uterus (endometrium).  Excessive (dysfunctional) uterine bleeding. When removing your uterus, your health care provider may also remove the organs that produce eggs (ovaries) and the tubes that carry eggs to your  uterus (fallopian tubes). After a vaginal hysterectomy, you will no longer be able to have a baby. You will also no longer get your menstrual period. Tell a health care provider about:  Any allergies you have.  All medicines you are taking, including vitamins, herbs, eye drops, creams, and over-the-counter medicines.  Any problems you or family members have had with anesthetic medicines.  Any blood disorders you have.  Any surgeries you have had.  Any medical conditions you have.  Whether you are pregnant or may be pregnant. What are the risks? Generally, this is a safe procedure. However, problems may occur, including:  Bleeding.  Infection.  A blood clot that forms in your leg and travels to your lungs (pulmonary embolism).  Damage to surrounding organs.  Pain during sex. What happens before the procedure?  Ask your health care provider what organs will be removed during surgery.  Ask your health care provider about:  Changing or stopping your regular medicines. This is especially important if you are taking diabetes medicines or blood thinners.  Taking medicines such as aspirin and ibuprofen. These medicines can thin your blood. Do not take these medicines before your procedure if your health care provider instructs you not to.  Follow instructions from your health care provider about eating or drinking restrictions.  Do not use any tobacco products, such as cigarettes, chewing tobacco, and e-cigarettes. If you need help quitting, ask your health  care provider.  Plan to have someone take you home after discharge from the hospital. What happens during the procedure?  To reduce your risk of infection:  Your health care team will wash or sanitize their hands.  Your skin will be washed with soap.  An IV tube will be inserted into one of your veins.  You may be given antibiotic medicine to help prevent infection.  You will be given one or more of the  following:  A medicine to help you relax (sedative).  A medicine to numb the area (local anesthetic).  A medicine to make you fall asleep (general anesthetic).  A medicine that is injected into an area of your body to numb everything beyond the injection site (regional anesthetic).  Your surgeon will make an incision in your vagina.  Your surgeon will locate and remove all or part of your uterus.  Your ovaries and fallopian tubes may be removed at the same time.  The incision will be closed with stitches (sutures) that dissolve over time. The procedure may vary among health care providers and hospitals. What happens after the procedure?  Your blood pressure, heart rate, breathing rate, and blood oxygen level will be monitored often until the medicines you were given have worn off.  You will be encouraged to get up and walk around after a few hours to help prevent complications.  You may have IV tubes in place for a few days.  You will be given pain medicine as needed.  Do not drive for 24 hours if you were given a sedative. This information is not intended to replace advice given to you by your health care provider. Make sure you discuss any questions you have with your health care provider. Document Released: 07/09/2015 Document Revised: 08/23/2015 Document Reviewed: 04/01/2015 Elsevier Interactive Patient Education  2017 [employer]  Vaginal Hysterectomy, Care After Refer to this sheet in the next few weeks. These instructions provide you with information about caring for yourself after your procedure. Your health care provider may also give you more specific instructions. Your treatment has been planned according to current medical practices, but problems sometimes occur. Call your health care provider if you have any problems or questions after your procedure. What can I expect after the procedure? After the procedure, it is common to have:  Pain.  Soreness and numbness  in your incision areas.  Vaginal bleeding and discharge.  Constipation.  Temporary problems emptying the bladder.  Feelings of sadness or other emotions. Follow these instructions at home: Medicines   Take over-the-counter and prescription medicines only as told by your health care provider.  If you were prescribed an antibiotic medicine, take it as told by your health care provider. Do not stop taking the antibiotic even if you start to feel better.  Do not drive or operate heavy machinery while taking prescription pain medicine. Activity   Return to your normal activities as told by your health care provider. Ask your health care provider what activities are safe for you.  Get regular exercise as told by your health care provider. You may be told to take short walks every day and go farther each time.  Do not lift anything that is heavier than 10 lb (4.5 kg). General instructions    Do not put anything in your vagina for 6 weeks after your surgery or as told by your health care provider. This includes tampons and douches.  Do not have sex until your health care provider  says you can.  Do not take baths, swim, or use a hot tub until your health care provider approves.  Drink enough fluid to keep your urine clear or pale yellow.  Do not drive for 24 hours if you were given a sedative.  Keep all follow-up visits as told by your health care provider. This is important. Contact a health care provider if:  Your pain medicine is not helping.  You have a fever.  You have redness, swelling, or pain at your incision site.  You have blood, pus, or a bad-smelling discharge from your vagina.  You continue to have difficulty urinating. Get help right away if:  You have severe abdominal or back pain.  You have heavy bleeding from your vagina.  You have chest pain or shortness of breath. This information is not intended to replace advice given to you by your health care  provider. Make sure you discuss any questions you have with your health care provider. Document Released: 07/09/2015 Document Revised: 08/23/2015 Document Reviewed: 04/01/2015 Elsevier Interactive Patient Education  2017 [employer]  General Anesthesia, Adult General anesthesia is the use of medicines to make a person "go to sleep" (be unconscious) for a medical procedure. General anesthesia is often recommended when a procedure:  Is long.  Requires you to be still or in an unusual position.  Is major and can cause you to lose blood.  Is impossible to do without general anesthesia. The medicines used for general anesthesia are called general anesthetics. In addition to making you sleep, the medicines:  Prevent pain.  Control your blood pressure.  Relax your muscles. Tell a health care provider about:  Any allergies you have.  All medicines you are taking, including vitamins, herbs, eye drops, creams, and over-the-counter medicines.  Any problems you or family members have had with anesthetic medicines.  Types of anesthetics you have had in the past.  Any bleeding disorders you have.  Any surgeries you have had.  Any medical conditions you have.  Any history of heart or lung conditions, such as heart failure, sleep apnea, or chronic obstructive pulmonary disease (COPD).  Whether you are pregnant or may be pregnant.  Whether you use tobacco, alcohol, marijuana, or street drugs.  Any history of Financial planner.  Any history of depression or anxiety. What are the risks? Generally, this is a safe procedure. However, problems may occur, including:  Allergic reaction to anesthetics.  Lung and heart problems.  Inhaling food or liquids from your stomach into your lungs (aspiration).  Injury to nerves.  Waking up during your procedure and being unable to move (rare).  Extreme agitation or a state of mental confusion (delirium) when you wake up from the  anesthetic.  Air in the bloodstream, which can lead to stroke. These problems are more likely to develop if you are having a major surgery or if you have an advanced medical condition. You can prevent some of these complications by answering all of your health care provider's questions thoroughly and by following all pre-procedure instructions. General anesthesia can cause side effects, including:  Nausea or vomiting  A sore throat from the breathing tube.  Feeling cold or shivery.  Feeling tired, washed out, or achy.  Sleepiness or drowsiness.  Confusion or agitation. What happens before the procedure? Staying hydrated  Follow instructions from your health care provider about hydration, which may include:  Up to 2 hours before the procedure - you may continue to drink clear liquids, such as  water, clear fruit juice, black coffee, and plain tea. Eating and drinking restrictions  Follow instructions from your health care provider about eating and drinking, which may include:  8 hours before the procedure - stop eating heavy meals or foods such as meat, fried foods, or fatty foods.  6 hours before the procedure - stop eating light meals or foods, such as toast or cereal.  6 hours before the procedure - stop drinking milk or drinks that contain milk.  2 hours before the procedure - stop drinking clear liquids. Medicines   Ask your health care provider about:  Changing or stopping your regular medicines. This is especially important if you are taking diabetes medicines or blood thinners.  Taking medicines such as aspirin and ibuprofen. These medicines can thin your blood. Do not take these medicines before your procedure if your health care provider instructs you not to.  Taking new dietary supplements or medicines. Do not take these during the week before your procedure unless your health care provider approves them.  If you are told to take a medicine or to continue taking a  medicine on the day of the procedure, take the medicine with sips of water. General instructions    Ask if you will be going home the same day, the following day, or after a longer hospital stay.  Plan to have someone take you home.  Plan to have someone stay with you for the first 24 hours after you leave the hospital or clinic.  For 3-6 weeks before the procedure, try not to use any tobacco products, such as cigarettes, chewing tobacco, and e-cigarettes.  You may brush your teeth on the morning of the procedure, but make sure to spit out the toothpaste. What happens during the procedure?  You will be given anesthetics through a mask and through an IV tube in one of your veins.  You may receive medicine to help you relax (sedative).  As soon as you are asleep, a breathing tube may be used to help you breathe.  An anesthesia specialist will stay with you throughout the procedure. He or she will help keep you comfortable and safe by continuing to give you medicines and adjusting the amount of medicine that you get. He or she will also watch your blood pressure, pulse, and oxygen levels to make sure that the anesthetics do not cause any problems.  If a breathing tube was used to help you breathe, it will be removed before you wake up. The procedure may vary among health care providers and hospitals. What happens after the procedure?  You will wake up, often slowly, after the procedure is complete, usually in a recovery area.  Your blood pressure, heart rate, breathing rate, and blood oxygen level will be monitored until the medicines you were given have worn off.  You may be given medicine to help you calm down if you feel anxious or agitated.  If you will be going home the same day, your health care provider may check to make sure you can stand, drink, and urinate.  Your health care providers will treat your pain and side effects before you go home.  Do not drive for 24 hours if  you received a sedative.  You may:  Feel nauseous and vomit.  Have a sore throat.  Have mental slowness.  Feel cold or shivery.  Feel sleepy.  Feel tired.  Feel sore or achy, even in parts of your body where you did not have  surgery. This information is not intended to replace advice given to you by your health care provider. Make sure you discuss any questions you have with your health care provider. Document Released: 06/24/2007 Document Revised: 08/28/2015 Document Reviewed: 03/01/2015 Elsevier Interactive Patient Education  2017 [employer] Inc. General Anesthesia, Adult, Care After These instructions provide you with information about caring for yourself after your procedure. Your health care provider may also give you more specific instructions. Your treatment has been planned according to current medical practices, but problems sometimes occur. Call your health care provider if you have any problems or questions after your procedure. What can I expect after the procedure? After the procedure, it is common to have:  Vomiting.  A sore throat.  Mental slowness. It is common to feel:  Nauseous.  Cold or shivery.  Sleepy.  Tired.  Sore or achy, even in parts of your body where you did not have surgery. Follow these instructions at home: For at least 24 hours after the procedure:   Do not:  Participate in activities where you could fall or become injured.  Drive.  Use heavy machinery.  Drink alcohol.  Take sleeping pills or medicines that cause drowsiness.  Make important decisions or sign legal documents.  Take care of children on your own.  Rest. Eating and drinking   If you vomit, drink water, juice, or soup when you can drink without vomiting.  Drink enough fluid to keep your urine clear or pale yellow.  Make sure you have little or no nausea before eating solid foods.  Follow the diet recommended by your health care provider. General  instructions   Have a responsible adult stay with you until you are awake and alert.  Return to your normal activities as told by your health care provider. Ask your health care provider what activities are safe for you.  Take over-the-counter and prescription medicines only as told by your health care provider.  If you smoke, do not smoke without supervision.  Keep all follow-up visits as told by your health care provider. This is important. Contact a health care provider if:  You continue to have nausea or vomiting at home, and medicines are not helpful.  You cannot drink fluids or start eating again.  You cannot urinate after 8-12 hours.  You develop a skin rash.  You have fever.  You have increasing redness at the site of your procedure. Get help right away if:  You have difficulty breathing.  You have chest pain.  You have unexpected bleeding.  You feel that you are having a life-threatening or urgent problem. This information is not intended to replace advice given to you by your health care provider. Make sure you discuss any questions you have with your health care provider. Document Released: 06/23/2000 Document Revised: 08/20/2015 Document Reviewed: 03/01/2015 Elsevier Interactive Patient Education  2017 [employer].

## 2016-06-19 ENCOUNTER — Encounter: Payer: Self-pay | Admitting: Obstetrics & Gynecology

## 2016-06-19 ENCOUNTER — Encounter (HOSPITAL_COMMUNITY)
Admission: RE | Admit: 2016-06-19 | Discharge: 2016-06-19 | Disposition: A | Discharge status: Home / Self Care | Payer: Medicaid Other | Source: Ambulatory Visit | Attending: Obstetrics & Gynecology | Admitting: Obstetrics & Gynecology

## 2016-06-19 ENCOUNTER — Ambulatory Visit (INDEPENDENT_AMBULATORY_CARE_PROVIDER_SITE_OTHER): Payer: Medicaid Other | Admitting: Obstetrics & Gynecology

## 2016-06-19 ENCOUNTER — Encounter (HOSPITAL_COMMUNITY): Payer: Self-pay

## 2016-06-19 ENCOUNTER — Other Ambulatory Visit: Payer: Self-pay

## 2016-06-19 ENCOUNTER — Other Ambulatory Visit: Payer: Self-pay | Admitting: Obstetrics & Gynecology

## 2016-06-19 VITALS — BP 110/80 | HR 78 | Ht 61.0 in | Wt 141.3 lb

## 2016-06-19 DIAGNOSIS — N920 Excessive and frequent menstruation with regular cycle: Secondary | ICD-10-CM | POA: Diagnosis not present

## 2016-06-19 DIAGNOSIS — E039 Hypothyroidism, unspecified: Secondary | ICD-10-CM | POA: Insufficient documentation

## 2016-06-19 DIAGNOSIS — F329 Major depressive disorder, single episode, unspecified: Secondary | ICD-10-CM | POA: Diagnosis not present

## 2016-06-19 DIAGNOSIS — N946 Dysmenorrhea, unspecified: Secondary | ICD-10-CM | POA: Diagnosis not present

## 2016-06-19 DIAGNOSIS — N9412 Deep dyspareunia: Secondary | ICD-10-CM | POA: Diagnosis not present

## 2016-06-19 DIAGNOSIS — Z01810 Encounter for preprocedural cardiovascular examination: Secondary | ICD-10-CM | POA: Insufficient documentation

## 2016-06-19 DIAGNOSIS — G90521 Complex regional pain syndrome I of right lower limb: Secondary | ICD-10-CM | POA: Diagnosis not present

## 2016-06-19 DIAGNOSIS — Z01818 Encounter for other preprocedural examination: Secondary | ICD-10-CM | POA: Diagnosis not present

## 2016-06-19 DIAGNOSIS — R9431 Abnormal electrocardiogram [ECG] [EKG]: Secondary | ICD-10-CM | POA: Insufficient documentation

## 2016-06-19 DIAGNOSIS — Z01812 Encounter for preprocedural laboratory examination: Secondary | ICD-10-CM | POA: Diagnosis present

## 2016-06-19 DIAGNOSIS — K219 Gastro-esophageal reflux disease without esophagitis: Secondary | ICD-10-CM | POA: Insufficient documentation

## 2016-06-19 HISTORY — DX: Hypothyroidism, unspecified: E03.9

## 2016-06-19 HISTORY — DX: Gastro-esophageal reflux disease without esophagitis: K21.9

## 2016-06-19 LAB — TYPE AND SCREEN
ABO/RH(D): AB POS
ANTIBODY SCREEN: NEGATIVE

## 2016-06-19 LAB — COMPREHENSIVE METABOLIC PANEL
ALBUMIN: 4.4 g/dL (ref 3.5–5.0)
ALT: 31 U/L (ref 14–54)
AST: 29 U/L (ref 15–41)
Alkaline Phosphatase: 53 U/L (ref 38–126)
Anion gap: 8 (ref 5–15)
BUN: 7 mg/dL (ref 6–20)
CALCIUM: 9.9 mg/dL (ref 8.9–10.3)
CHLORIDE: 106 mmol/L (ref 101–111)
CO2: 24 mmol/L (ref 22–32)
Creatinine, Ser: 0.82 mg/dL (ref 0.44–1.00)
GFR calc Af Amer: >60 mL/min (ref >60)
GFR calc non Af Amer: >60 mL/min (ref >60)
GLUCOSE: 91 mg/dL (ref 65–99)
Potassium: 3.8 mmol/L (ref 3.5–5.1)
SODIUM: 138 mmol/L (ref 135–145)
Total Bilirubin: 0.6 mg/dL (ref 0.3–1.2)
Total Protein: 7.8 g/dL (ref 6.5–8.1)

## 2016-06-19 LAB — URINALYSIS, ROUTINE W REFLEX MICROSCOPIC
BILIRUBIN URINE: NEGATIVE
Glucose, UA: NEGATIVE mg/dL
Hgb urine dipstick: NEGATIVE
KETONES UR: NEGATIVE mg/dL
Leukocytes, UA: NEGATIVE
NITRITE: NEGATIVE
PH: 5 (ref 5.0–8.0)
Protein, ur: NEGATIVE mg/dL
SPECIFIC GRAVITY, URINE: 1.018 (ref 1.005–1.030)

## 2016-06-19 LAB — CBC
HCT: 45 % (ref 36.0–46.0)
HEMOGLOBIN: 15.8 g/dL — AB (ref 12.0–15.0)
MCH: 31.6 pg (ref 26.0–34.0)
MCHC: 35.1 g/dL (ref 30.0–36.0)
MCV: 90 fL (ref 78.0–100.0)
Platelets: 228 10*3/uL (ref 150–400)
RBC: 5 MIL/uL (ref 3.87–5.11)
RDW: 12.4 % (ref 11.5–15.5)
WBC: 11.9 10*3/uL — AB (ref 4.0–10.5)

## 2016-06-19 LAB — RAPID HIV SCREEN (HIV 1/2 AB+AG)
HIV 1/2 ANTIBODIES: NONREACTIVE
HIV-1 P24 Antigen - HIV24: NONREACTIVE

## 2016-06-19 LAB — HCG, QUANTITATIVE, PREGNANCY: hCG, Beta Chain, Quant, S: <1 m[IU]/mL (ref <5)

## 2016-06-19 NOTE — Progress Notes (Signed)
Preoperative History and Physical  Aimee Priceis a 35 y.o.G4P0 with No LMP recorded.admitted for a TVH due to dysmenorrhea bump dyspareunia and chronic cramping.  Despite stopping her bleeding she has ontinue to have cramping and intercourse pain Not lateral, if normal will preserve ovaries Sonogram is normal  She continues to have pain even on the megestrol  PMH:      Past Medical History:  Diagnosis Date  . Depression   . Ovarian cyst   . Pneumothorax     PSH:       Past Surgical History:  Procedure Laterality Date  . CARDIAC VALVE SURGERY    . CATARACT EXTRACTION    . leap procedure    . lung procedure     procedure for collapsed lung  . right foot screws     . tubal ligations      POb/GynH:          OB History   Gravida Para Term Preterm AB Living   4        SAB TAB Ectopic Multiple Live Births             SH:        Social History  Substance Use Topics  . Smoking status: Former Smoker    Quit date: 08/08/2015  . Smokeless tobacco: Current User    Types: Chew  . Alcohol use Yes      Comment: occasionally    FH:       Family History  Problem Relation Age of Onset  . Adopted: Yes  . Endometriosis Mother      Allergies:      Allergies  Allergen Reactions  . Bee Venom Anaphylaxis    Pt states was a small child and can not remember allergy reaction  . Ibuprofen Hives    Medications:  Current Outpatient Prescriptions:  . levothyroxine (SYNTHROID, LEVOTHROID) 50 MCG tablet, Take 150 mcg by mouth daily before breakfast. , Disp: , Rfl:  . megestrol (MEGACE) 40 MG tablet, Take 1 tablet (40 mg total) by mouth daily., Disp: 30 tablet, Rfl: 11 . Multiple Vitamin (MULTIVITAMIN WITH MINERALS) TABS tablet, Take 1 tablet by mouth daily., Disp: , Rfl:  . omeprazole (PRILOSEC) 20 MG capsule, Take 1 capsule (20 mg total) by mouth daily. 1 tablet  a day, Disp: 30 capsule, Rfl: 6  Review of Systems:  Review of Systems  Constitutional: Negative for fever, chills, weight loss, malaise/fatigue and diaphoresis.  HENT: Negative for hearing loss, ear pain, nosebleeds, congestion, sore throat, neck pain, tinnitus and ear discharge.  Eyes: Negative for blurred vision, double vision, photophobia, pain, discharge and redness.  Respiratory: Negative for cough, hemoptysis, sputum production, shortness of breath, wheezing and stridor.  Cardiovascular: Negative for chest pain, palpitations, orthopnea, claudication, leg swelling and PND.  Gastrointestinal: Negative for abdominal pain. Negative for heartburn, nausea, vomiting, diarrhea, constipation, blood in stool and melena.  Genitourinary: Negative for dysuria, urgency, frequency, hematuria and flank pain.  Musculoskeletal: Negative for myalgias, back pain, joint pain and falls.  Skin: Negative for itching and rash.  Neurological: Negative for dizziness, tingling, tremors, sensory change, speech change, focal weakness, seizures, loss of consciousness, weakness and headaches.  Endo/Heme/Allergies: Negative for environmental allergies and polydipsia. Does not bruise/bleed easily.  Psychiatric/Behavioral: Negative for depression, suicidal ideas, hallucinations, memory loss and substance abuse. The patient is not nervous/anxious and does not have insomnia.     PHYSICAL EXAM:  Blood pressure 108/78, pulse 79, weight 143 lb (64.9 kg).     Psychiatric/Behavioral: Negative for depression, suicidal ideas, hallucinations, memory loss and substance abuse. The patient is not nervous/anxious and does not have insomnia.      PHYSICAL EXAM:  Blood pressure 108/78, pulse 79, weight 143 lb (64.9 kg).    Vitals reviewed. Constitutional: She is oriented to person, place, and time. She appears well-developed and well-nourished.  HENT:  Head: Normocephalic and atraumatic.  Right Ear: External ear normal.  Left Ear: External ear normal.  Nose: Nose normal.  Mouth/Throat: Oropharynx is clear and moist.  Eyes: Conjunctivae and EOM are normal. Pupils are equal, round, and reactive to light. Right eye exhibits no discharge. Left eye exhibits no discharge. No scleral icterus.  Neck: Normal  range of motion. Neck supple. No tracheal deviation present. No thyromegaly present.  Cardiovascular: Normal rate, regular rhythm, normal heart sounds and intact distal pulses.  Exam reveals no gallop and no friction rub.   No murmur heard. Respiratory: Effort normal and breath sounds normal. No respiratory distress. She has no wheezes. She has no rales. She exhibits no tenderness.  GI: Soft. Bowel sounds are normal. She exhibits no distension and no mass. There is tenderness. There is no rebound and no guarding.  Genitourinary:       Vulva is normal without lesions Vagina is pink moist without discharge Cervix normal in appearance and pap is normal Uterus is NSSC tender on exam Adnexa is negative with normal sized ovaries by sonogram  Musculoskeletal: Normal range of motion. She exhibits no edema and no tenderness.  Neurological: She is alert and oriented to person, place, and time. She has normal reflexes. She displays normal reflexes. No cranial nerve deficit. She exhibits normal muscle tone. Coordination normal.  Skin: Skin is warm and dry. No rash noted. No erythema. No pallor.  Psychiatric: She has a normal mood and affect. Her behavior is normal. Judgment and thought content normal.    Labs: No results found for this or any previous visit (from the past 336 hour(s)).  EKG: No orders found for this or any previous visit.  Imaging Studies: ImagingResults  No results found.      Assessment: Dysmenorrhea  Menorrhagia with regular cycle  Deep dyspareunia  (no lateral pain)    Plan: TVH, preserve ovaries unless there is some unexpected issue, remove her tubes if possible for ovarian cancer prophylaxis  Pt understands the risks of surgery including but not limited t  excessive bleeding requiring transfusion or reoperation, post-operative infection requiring prolonged hospitalization or re-hospitalization and antibiotic therapy, and damage to other organs  including bladder, bowel, ureters and major vessels.  The patient also understands the alternative treatment options which were discussed in full.  All questions were answered.   EURE,LUTHER H 06/19/2016 10:46 AM

## 2016-06-20 ENCOUNTER — Other Ambulatory Visit (HOSPITAL_COMMUNITY): Payer: Medicaid Other

## 2016-06-24 ENCOUNTER — Encounter: Payer: Self-pay | Admitting: Obstetrics & Gynecology

## 2016-07-03 ENCOUNTER — Encounter: Payer: Medicaid Other | Admitting: Obstetrics & Gynecology

## 2016-07-04 NOTE — Patient Instructions (Signed)
Aimee Price  07/04/2016     @   Your procedure is scheduled on  07/09/2016 .  Report to [facility] at  700  A.M.  Call this number if you have problems the morning of surgery:  [phone]   Remember:  Do not eat food or drink liquids after midnight.  Take these medicines the morning of surgery with A SIP OF WATER  Levothyroxine, prilosec.   Do not wear jewelry, make-up or nail polish.  Do not wear lotions, powders, or perfumes, or deoderant.  Do not shave 48 hours prior to surgery.  Men may shave face and neck.  Do not bring valuables to the hospital.  [facility] is not responsible for any belongings or valuables.  Contacts, dentures or bridgework may not be worn into surgery.  Leave your suitcase in the car.  After surgery it may be brought to your room.  For patients admitted to the hospital, discharge time will be determined by your treatment team.  Patients discharged the day of surgery will not be allowed to drive home.   Name and phone number of your driver:   family Special instructions:  None  Please read over the following fact sheets that you were given. Anesthesia Post-op Instructions and Care and Recovery After Surgery       Vaginal Hysterectomy A vaginal hysterectomy is a procedure to remove all or part of the uterus through a small incision in the vagina. In this procedure, your health care provider may remove your entire uterus, including the lower end (cervix). You may need a vaginal hysterectomy to treat:  Uterine fibroids.  A condition that causes the lining of the uterus to grow in other areas (endometriosis).  Problems with pelvic support.  Cancer of the cervix, ovaries, uterus, or tissue that lines the uterus (endometrium).  Excessive (dysfunctional) uterine bleeding. When removing your uterus, your health care provider may also remove the organs that produce eggs (ovaries) and the tubes that carry eggs to  your uterus (fallopian tubes). After a vaginal hysterectomy, you will no longer be able to have a baby. You will also no longer get your menstrual period. Tell a health care provider about:  Any allergies you have.  All medicines you are taking, including vitamins, herbs, eye drops, creams, and over-the-counter medicines.  Any problems you or family members have had with anesthetic medicines.  Any blood disorders you have.  Any surgeries you have had.  Any medical conditions you have.  Whether you are pregnant or may be pregnant. What are the risks? Generally, this is a safe procedure. However, problems may occur, including:  Bleeding.  Infection.  A blood clot that forms in your leg and travels to your lungs (pulmonary embolism).  Damage to surrounding organs.  Pain during sex. What happens before the procedure?  Ask your health care provider what organs will be removed during surgery.  Ask your health care provider about:  Changing or stopping your regular medicines. This is especially important if you are taking diabetes medicines or blood thinners.  Taking medicines such as aspirin and ibuprofen. These medicines can thin your blood. Do not take these medicines before your procedure if your health care provider instructs you not to.  Follow instructions from your health care provider about eating or drinking restrictions.  Do not use any tobacco products, such as cigarettes, chewing tobacco, and e-cigarettes. If you need help quitting, ask your  health care provider.  Plan to have someone take you home after discharge from the hospital. What happens during the procedure?  To reduce your risk of infection:  Your health care team will wash or sanitize their hands.  Your skin will be washed with soap.  An IV tube will be inserted into one of your veins.  You may be given antibiotic medicine to help prevent infection.  You will be given one or more of the  following:  A medicine to help you relax (sedative).  A medicine to numb the area (local anesthetic).  A medicine to make you fall asleep (general anesthetic).  A medicine that is injected into an area of your body to numb everything beyond the injection site (regional anesthetic).  Your surgeon will make an incision in your vagina.  Your surgeon will locate and remove all or part of your uterus.  Your ovaries and fallopian tubes may be removed at the same time.  The incision will be closed with stitches (sutures) that dissolve over time. The procedure may vary among health care providers and hospitals. What happens after the procedure?  Your blood pressure, heart rate, breathing rate, and blood oxygen level will be monitored often until the medicines you were given have worn off.  You will be encouraged to get up and walk around after a few hours to help prevent complications.  You may have IV tubes in place for a few days.  You will be given pain medicine as needed.  Do not drive for 24 hours if you were given a sedative. This information is not intended to replace advice given to you by your health care provider. Make sure you discuss any questions you have with your health care provider. Document Released: 07/09/2015 Document Revised: 08/23/2015 Document Reviewed: 04/01/2015 Elsevier Interactive Patient Education  2017 [employer]  Vaginal Hysterectomy, Care After Refer to this sheet in the next few weeks. These instructions provide you with information about caring for yourself after your procedure. Your health care provider may also give you more specific instructions. Your treatment has been planned according to current medical practices, but problems sometimes occur. Call your health care provider if you have any problems or questions after your procedure. What can I expect after the procedure? After the procedure, it is common to have:  Pain.  Soreness and numbness  in your incision areas.  Vaginal bleeding and discharge.  Constipation.  Temporary problems emptying the bladder.  Feelings of sadness or other emotions. Follow these instructions at home: Medicines   Take over-the-counter and prescription medicines only as told by your health care provider.  If you were prescribed an antibiotic medicine, take it as told by your health care provider. Do not stop taking the antibiotic even if you start to feel better.  Do not drive or operate heavy machinery while taking prescription pain medicine. Activity   Return to your normal activities as told by your health care provider. Ask your health care provider what activities are safe for you.  Get regular exercise as told by your health care provider. You may be told to take short walks every day and go farther each time.  Do not lift anything that is heavier than 10 lb (4.5 kg). General instructions    Do not put anything in your vagina for 6 weeks after your surgery or as told by your health care provider. This includes tampons and douches.  Do not have sex until your health care  provider says you can.  Do not take baths, swim, or use a hot tub until your health care provider approves.  Drink enough fluid to keep your urine clear or pale yellow.  Do not drive for 24 hours if you were given a sedative.  Keep all follow-up visits as told by your health care provider. This is important. Contact a health care provider if:  Your pain medicine is not helping.  You have a fever.  You have redness, swelling, or pain at your incision site.  You have blood, pus, or a bad-smelling discharge from your vagina.  You continue to have difficulty urinating. Get help right away if:  You have severe abdominal or back pain.  You have heavy bleeding from your vagina.  You have chest pain or shortness of breath. This information is not intended to replace advice given to you by your health care  provider. Make sure you discuss any questions you have with your health care provider. Document Released: 07/09/2015 Document Revised: 08/23/2015 Document Reviewed: 04/01/2015 Elsevier Interactive Patient Education  2017 [employer]  General Anesthesia, Adult General anesthesia is the use of medicines to make a person "go to sleep" (be unconscious) for a medical procedure. General anesthesia is often recommended when a procedure:  Is long.  Requires you to be still or in an unusual position.  Is major and can cause you to lose blood.  Is impossible to do without general anesthesia. The medicines used for general anesthesia are called general anesthetics. In addition to making you sleep, the medicines:  Prevent pain.  Control your blood pressure.  Relax your muscles. Tell a health care provider about:  Any allergies you have.  All medicines you are taking, including vitamins, herbs, eye drops, creams, and over-the-counter medicines.  Any problems you or family members have had with anesthetic medicines.  Types of anesthetics you have had in the past.  Any bleeding disorders you have.  Any surgeries you have had.  Any medical conditions you have.  Any history of heart or lung conditions, such as heart failure, sleep apnea, or chronic obstructive pulmonary disease (COPD).  Whether you are pregnant or may be pregnant.  Whether you use tobacco, alcohol, marijuana, or street drugs.  Any history of Financial planner.  Any history of depression or anxiety. What are the risks? Generally, this is a safe procedure. However, problems may occur, including:  Allergic reaction to anesthetics.  Lung and heart problems.  Inhaling food or liquids from your stomach into your lungs (aspiration).  Injury to nerves.  Waking up during your procedure and being unable to move (rare).  Extreme agitation or a state of mental confusion (delirium) when you wake up from the  anesthetic.  Air in the bloodstream, which can lead to stroke. These problems are more likely to develop if you are having a major surgery or if you have an advanced medical condition. You can prevent some of these complications by answering all of your health care provider's questions thoroughly and by following all pre-procedure instructions. General anesthesia can cause side effects, including:  Nausea or vomiting  A sore throat from the breathing tube.  Feeling cold or shivery.  Feeling tired, washed out, or achy.  Sleepiness or drowsiness.  Confusion or agitation. What happens before the procedure? Staying hydrated  Follow instructions from your health care provider about hydration, which may include:  Up to 2 hours before the procedure - you may continue to drink clear liquids, such  as water, clear fruit juice, black coffee, and plain tea. Eating and drinking restrictions  Follow instructions from your health care provider about eating and drinking, which may include:  8 hours before the procedure - stop eating heavy meals or foods such as meat, fried foods, or fatty foods.  6 hours before the procedure - stop eating light meals or foods, such as toast or cereal.  6 hours before the procedure - stop drinking milk or drinks that contain milk.  2 hours before the procedure - stop drinking clear liquids. Medicines   Ask your health care provider about:  Changing or stopping your regular medicines. This is especially important if you are taking diabetes medicines or blood thinners.  Taking medicines such as aspirin and ibuprofen. These medicines can thin your blood. Do not take these medicines before your procedure if your health care provider instructs you not to.  Taking new dietary supplements or medicines. Do not take these during the week before your procedure unless your health care provider approves them.  If you are told to take a medicine or to continue taking a  medicine on the day of the procedure, take the medicine with sips of water. General instructions    Ask if you will be going home the same day, the following day, or after a longer hospital stay.  Plan to have someone take you home.  Plan to have someone stay with you for the first 24 hours after you leave the hospital or clinic.  For 3-6 weeks before the procedure, try not to use any tobacco products, such as cigarettes, chewing tobacco, and e-cigarettes.  You may brush your teeth on the morning of the procedure, but make sure to spit out the toothpaste. What happens during the procedure?  You will be given anesthetics through a mask and through an IV tube in one of your veins.  You may receive medicine to help you relax (sedative).  As soon as you are asleep, a breathing tube may be used to help you breathe.  An anesthesia specialist will stay with you throughout the procedure. He or she will help keep you comfortable and safe by continuing to give you medicines and adjusting the amount of medicine that you get. He or she will also watch your blood pressure, pulse, and oxygen levels to make sure that the anesthetics do not cause any problems.  If a breathing tube was used to help you breathe, it will be removed before you wake up. The procedure may vary among health care providers and hospitals. What happens after the procedure?  You will wake up, often slowly, after the procedure is complete, usually in a recovery area.  Your blood pressure, heart rate, breathing rate, and blood oxygen level will be monitored until the medicines you were given have worn off.  You may be given medicine to help you calm down if you feel anxious or agitated.  If you will be going home the same day, your health care provider may check to make sure you can stand, drink, and urinate.  Your health care providers will treat your pain and side effects before you go home.  Do not drive for 24 hours if  you received a sedative.  You may:  Feel nauseous and vomit.  Have a sore throat.  Have mental slowness.  Feel cold or shivery.  Feel sleepy.  Feel tired.  Feel sore or achy, even in parts of your body where you did not  have surgery. This information is not intended to replace advice given to you by your health care provider. Make sure you discuss any questions you have with your health care provider. Document Released: 06/24/2007 Document Revised: 08/28/2015 Document Reviewed: 03/01/2015 Elsevier Interactive Patient Education  2017 [employer]. General Anesthesia, Adult, Care After These instructions provide you with information about caring for yourself after your procedure. Your health care provider may also give you more specific instructions. Your treatment has been planned according to current medical practices, but problems sometimes occur. Call your health care provider if you have any problems or questions after your procedure. What can I expect after the procedure? After the procedure, it is common to have:  Vomiting.  A sore throat.  Mental slowness. It is common to feel:  Nauseous.  Cold or shivery.  Sleepy.  Tired.  Sore or achy, even in parts of your body where you did not have surgery. Follow these instructions at home: For at least 24 hours after the procedure:   Do not:  Participate in activities where you could fall or become injured.  Drive.  Use heavy machinery.  Drink alcohol.  Take sleeping pills or medicines that cause drowsiness.  Make important decisions or sign legal documents.  Take care of children on your own.  Rest. Eating and drinking   If you vomit, drink water, juice, or soup when you can drink without vomiting.  Drink enough fluid to keep your urine clear or pale yellow.  Make sure you have little or no nausea before eating solid foods.  Follow the diet recommended by your health care provider. General  instructions   Have a responsible adult stay with you until you are awake and alert.  Return to your normal activities as told by your health care provider. Ask your health care provider what activities are safe for you.  Take over-the-counter and prescription medicines only as told by your health care provider.  If you smoke, do not smoke without supervision.  Keep all follow-up visits as told by your health care provider. This is important. Contact a health care provider if:  You continue to have nausea or vomiting at home, and medicines are not helpful.  You cannot drink fluids or start eating again.  You cannot urinate after 8-12 hours.  You develop a skin rash.  You have fever.  You have increasing redness at the site of your procedure. Get help right away if:  You have difficulty breathing.  You have chest pain.  You have unexpected bleeding.  You feel that you are having a life-threatening or urgent problem. This information is not intended to replace advice given to you by your health care provider. Make sure you discuss any questions you have with your health care provider. Document Released: 06/23/2000 Document Revised: 08/20/2015 Document Reviewed: 03/01/2015 Elsevier Interactive Patient Education  2017 [employer].

## 2016-07-07 ENCOUNTER — Encounter (HOSPITAL_COMMUNITY): Payer: Self-pay

## 2016-07-07 ENCOUNTER — Encounter (HOSPITAL_COMMUNITY)
Admission: RE | Admit: 2016-07-07 | Discharge: 2016-07-07 | Disposition: A | Discharge status: Home / Self Care | Payer: Medicaid Other | Source: Ambulatory Visit | Attending: Obstetrics & Gynecology | Admitting: Obstetrics & Gynecology

## 2016-07-07 DIAGNOSIS — F329 Major depressive disorder, single episode, unspecified: Secondary | ICD-10-CM | POA: Diagnosis not present

## 2016-07-07 DIAGNOSIS — Z01812 Encounter for preprocedural laboratory examination: Secondary | ICD-10-CM | POA: Diagnosis not present

## 2016-07-07 DIAGNOSIS — N83209 Unspecified ovarian cyst, unspecified side: Secondary | ICD-10-CM | POA: Insufficient documentation

## 2016-07-07 DIAGNOSIS — K219 Gastro-esophageal reflux disease without esophagitis: Secondary | ICD-10-CM | POA: Diagnosis not present

## 2016-07-07 DIAGNOSIS — E039 Hypothyroidism, unspecified: Secondary | ICD-10-CM | POA: Insufficient documentation

## 2016-07-07 DIAGNOSIS — G90521 Complex regional pain syndrome I of right lower limb: Secondary | ICD-10-CM | POA: Insufficient documentation

## 2016-07-07 LAB — CBC
HCT: 41.9 % (ref 36.0–46.0)
Hemoglobin: 14.9 g/dL (ref 12.0–15.0)
MCH: 31.7 pg (ref 26.0–34.0)
MCHC: 35.6 g/dL (ref 30.0–36.0)
MCV: 89.1 fL (ref 78.0–100.0)
Platelets: 237 10*3/uL (ref 150–400)
RBC: 4.7 MIL/uL (ref 3.87–5.11)
RDW: 12.3 % (ref 11.5–15.5)
WBC: 8.7 10*3/uL (ref 4.0–10.5)

## 2016-07-07 LAB — BASIC METABOLIC PANEL
Anion gap: 9 (ref 5–15)
BUN: 9 mg/dL (ref 6–20)
CALCIUM: 9.8 mg/dL (ref 8.9–10.3)
CO2: 22 mmol/L (ref 22–32)
CREATININE: 0.91 mg/dL (ref 0.44–1.00)
Chloride: 107 mmol/L (ref 101–111)
GFR calc Af Amer: >60 mL/min (ref >60)
GFR calc non Af Amer: >60 mL/min (ref >60)
Glucose, Bld: 97 mg/dL (ref 65–99)
Potassium: 3.3 mmol/L — ABNORMAL LOW (ref 3.5–5.1)
SODIUM: 138 mmol/L (ref 135–145)

## 2016-07-07 LAB — URINALYSIS, ROUTINE W REFLEX MICROSCOPIC
BILIRUBIN URINE: NEGATIVE
Glucose, UA: NEGATIVE mg/dL
Hgb urine dipstick: NEGATIVE
Ketones, ur: NEGATIVE mg/dL
Leukocytes, UA: NEGATIVE
Nitrite: NEGATIVE
PH: 6 (ref 5.0–8.0)
Protein, ur: NEGATIVE mg/dL
Specific Gravity, Urine: 1.004 — ABNORMAL LOW (ref 1.005–1.030)

## 2016-07-07 LAB — HCG, SERUM, QUALITATIVE: PREG SERUM: NEGATIVE

## 2016-07-07 LAB — TYPE AND SCREEN
ABO/RH(D): AB POS
Antibody Screen: NEGATIVE

## 2016-07-08 NOTE — Pre-Procedure Instructions (Signed)
Dr Jayme Cloud notified of potassium 3.3 and that she had potassium of 3.8 2 weeks ago. No orders given,

## 2016-07-09 ENCOUNTER — Ambulatory Visit (HOSPITAL_COMMUNITY): Payer: Medicaid Other | Admitting: Anesthesiology

## 2016-07-09 ENCOUNTER — Observation Stay (HOSPITAL_COMMUNITY)
Admission: RE | Admit: 2016-07-09 | Discharge: 2016-07-10 | Disposition: A | Discharge status: Home / Self Care | Payer: Medicaid Other | Source: Ambulatory Visit | Attending: Obstetrics & Gynecology | Admitting: Obstetrics & Gynecology

## 2016-07-09 ENCOUNTER — Encounter (HOSPITAL_COMMUNITY)
Admission: RE | Disposition: A | Discharge status: Home / Self Care | Payer: Self-pay | Source: Ambulatory Visit | Attending: Obstetrics & Gynecology

## 2016-07-09 ENCOUNTER — Encounter (HOSPITAL_COMMUNITY): Payer: Self-pay

## 2016-07-09 DIAGNOSIS — Z720 Tobacco use: Secondary | ICD-10-CM | POA: Insufficient documentation

## 2016-07-09 DIAGNOSIS — Z8742 Personal history of other diseases of the female genital tract: Secondary | ICD-10-CM | POA: Insufficient documentation

## 2016-07-09 DIAGNOSIS — N946 Dysmenorrhea, unspecified: Secondary | ICD-10-CM | POA: Diagnosis not present

## 2016-07-09 DIAGNOSIS — N9412 Deep dyspareunia: Secondary | ICD-10-CM | POA: Insufficient documentation

## 2016-07-09 DIAGNOSIS — Z90710 Acquired absence of both cervix and uterus: Secondary | ICD-10-CM | POA: Diagnosis present

## 2016-07-09 DIAGNOSIS — N920 Excessive and frequent menstruation with regular cycle: Secondary | ICD-10-CM | POA: Diagnosis not present

## 2016-07-09 DIAGNOSIS — N941 Unspecified dyspareunia: Secondary | ICD-10-CM | POA: Diagnosis not present

## 2016-07-09 DIAGNOSIS — Z91030 Bee allergy status: Secondary | ICD-10-CM | POA: Diagnosis not present

## 2016-07-09 DIAGNOSIS — Z9849 Cataract extraction status, unspecified eye: Secondary | ICD-10-CM | POA: Insufficient documentation

## 2016-07-09 DIAGNOSIS — N72 Inflammatory disease of cervix uteri: Principal | ICD-10-CM | POA: Insufficient documentation

## 2016-07-09 DIAGNOSIS — Z886 Allergy status to analgesic agent status: Secondary | ICD-10-CM | POA: Insufficient documentation

## 2016-07-09 DIAGNOSIS — F329 Major depressive disorder, single episode, unspecified: Secondary | ICD-10-CM | POA: Diagnosis not present

## 2016-07-09 HISTORY — PX: BILATERAL SALPINGECTOMY: SHX5743

## 2016-07-09 HISTORY — DX: Dysmenorrhea, unspecified: N94.6

## 2016-07-09 HISTORY — PX: VAGINAL HYSTERECTOMY: SHX2639

## 2016-07-09 HISTORY — DX: Other allergy status, other than to drugs and biological substances: Z91.09

## 2016-07-09 SURGERY — HYSTERECTOMY, VAGINAL
Anesthesia: General

## 2016-07-09 MED ORDER — BUPIVACAINE-EPINEPHRINE (PF) 0.5% -1:200000 IJ SOLN
INTRAMUSCULAR | Status: AC
  Filled 2016-07-09: qty 30

## 2016-07-09 MED ORDER — ONDANSETRON HCL 4 MG PO TABS: 8.0000 mg | ORAL_TABLET | Freq: Four times a day (QID) | ORAL | Status: DC | PRN

## 2016-07-09 MED ORDER — ROCURONIUM BROMIDE 100 MG/10ML IV SOLN
INTRAVENOUS | Status: DC | PRN
  Administered 2016-07-09: 5 mg via INTRAVENOUS
  Administered 2016-07-09: 15 mg via INTRAVENOUS

## 2016-07-09 MED ORDER — FENTANYL CITRATE (PF) 100 MCG/2ML IJ SOLN
INTRAMUSCULAR | Status: DC | PRN
  Administered 2016-07-09 (×2): 50 ug via INTRAVENOUS
  Administered 2016-07-09: 100 ug via INTRAVENOUS
  Administered 2016-07-09: 50 ug via INTRAVENOUS
  Administered 2016-07-09: 100 ug via INTRAVENOUS

## 2016-07-09 MED ORDER — LIDOCAINE HCL (CARDIAC) 20 MG/ML IV SOLN
INTRAVENOUS | Status: DC | PRN
  Administered 2016-07-09: 50 mg via INTRAVENOUS

## 2016-07-09 MED ORDER — FENTANYL CITRATE (PF) 100 MCG/2ML IJ SOLN
INTRAMUSCULAR | Status: AC
Start: 2016-07-09 — End: 2016-07-09
  Filled 2016-07-09: qty 2

## 2016-07-09 MED ORDER — 0.9 % SODIUM CHLORIDE (POUR BTL) OPTIME
TOPICAL | Status: DC | PRN
  Administered 2016-07-09: 1000 mL

## 2016-07-09 MED ORDER — LEVOTHYROXINE SODIUM 75 MCG PO TABS: 150.0000 ug | ORAL_TABLET | Freq: Every day | ORAL | Status: DC

## 2016-07-09 MED ORDER — BISACODYL 10 MG RE SUPP: 10.0000 mg | Freq: Every day | RECTAL | Status: DC | PRN

## 2016-07-09 MED ORDER — LEVOFLOXACIN IN D5W 750 MG/150ML IV SOLN
750.0000 mg | Freq: Once | INTRAVENOUS | Status: AC
  Administered 2016-07-09: 750 mg via INTRAVENOUS
  Filled 2016-07-09: qty 150

## 2016-07-09 MED ORDER — LACTATED RINGERS IV SOLN
INTRAVENOUS | Status: DC
  Administered 2016-07-09 (×2): via INTRAVENOUS

## 2016-07-09 MED ORDER — FENTANYL CITRATE (PF) 100 MCG/2ML IJ SOLN
INTRAMUSCULAR | Status: AC
  Filled 2016-07-09: qty 2

## 2016-07-09 MED ORDER — ONDANSETRON HCL 4 MG/2ML IJ SOLN
4.0000 mg | Freq: Four times a day (QID) | INTRAMUSCULAR | Status: DC | PRN
  Administered 2016-07-09: 4 mg via INTRAVENOUS
  Filled 2016-07-09: qty 2

## 2016-07-09 MED ORDER — FENTANYL CITRATE (PF) 250 MCG/5ML IJ SOLN
INTRAMUSCULAR | Status: AC
  Filled 2016-07-09: qty 5

## 2016-07-09 MED ORDER — ZOLPIDEM TARTRATE 5 MG PO TABS: 5.0000 mg | ORAL_TABLET | Freq: Every evening | ORAL | Status: DC | PRN

## 2016-07-09 MED ORDER — LEVOTHYROXINE SODIUM 75 MCG PO TABS
150.0000 ug | ORAL_TABLET | Freq: Every day | ORAL | Status: DC
  Administered 2016-07-10: 150 ug via ORAL
  Filled 2016-07-09: qty 2

## 2016-07-09 MED ORDER — PROMETHAZINE HCL 25 MG/ML IJ SOLN
25.0000 mg | Freq: Once | INTRAMUSCULAR | Status: AC
  Administered 2016-07-09: 25 mg via INTRAVENOUS
  Filled 2016-07-09: qty 1

## 2016-07-09 MED ORDER — HYDROMORPHONE HCL 1 MG/ML IJ SOLN
1.0000 mg | INTRAMUSCULAR | Status: DC | PRN
  Administered 2016-07-09 – 2016-07-10 (×4): 1 mg via INTRAVENOUS
  Filled 2016-07-09 (×3): qty 1

## 2016-07-09 MED ORDER — STERILE WATER FOR IRRIGATION IR SOLN
Status: DC | PRN
  Administered 2016-07-09: 1000 mL

## 2016-07-09 MED ORDER — DIPHENHYDRAMINE HCL 25 MG PO CAPS
25.0000 mg | ORAL_CAPSULE | Freq: Four times a day (QID) | ORAL | Status: DC | PRN
  Administered 2016-07-09: 25 mg via ORAL
  Filled 2016-07-09: qty 1

## 2016-07-09 MED ORDER — SODIUM CHLORIDE 0.9 % IR SOLN
Status: DC | PRN
  Administered 2016-07-09: 3000 mL

## 2016-07-09 MED ORDER — PANTOPRAZOLE SODIUM 40 MG IV SOLR
40.0000 mg | Freq: Once | INTRAVENOUS | Status: AC
  Administered 2016-07-10: 40 mg via INTRAVENOUS
  Filled 2016-07-09: qty 40

## 2016-07-09 MED ORDER — ROCURONIUM BROMIDE 50 MG/5ML IV SOLN
INTRAVENOUS | Status: AC
  Filled 2016-07-09: qty 1

## 2016-07-09 MED ORDER — ONDANSETRON HCL 4 MG/2ML IJ SOLN
INTRAMUSCULAR | Status: AC
  Filled 2016-07-09: qty 2

## 2016-07-09 MED ORDER — PROPOFOL 10 MG/ML IV BOLUS
INTRAVENOUS | Status: AC
  Filled 2016-07-09: qty 20

## 2016-07-09 MED ORDER — HYDROMORPHONE HCL 1 MG/ML IJ SOLN
0.2500 mg | INTRAMUSCULAR | Status: DC | PRN
  Administered 2016-07-09 (×2): 0.5 mg via INTRAVENOUS
  Filled 2016-07-09: qty 1

## 2016-07-09 MED ORDER — ADULT MULTIVITAMIN W/MINERALS CH: 1.0000 | ORAL_TABLET | Freq: Every day | ORAL | Status: DC

## 2016-07-09 MED ORDER — OXYCODONE-ACETAMINOPHEN 5-325 MG PO TABS
1.0000 | ORAL_TABLET | ORAL | Status: DC | PRN
  Administered 2016-07-10 (×3): 2 via ORAL
  Filled 2016-07-09 (×3): qty 2

## 2016-07-09 MED ORDER — SENNOSIDES-DOCUSATE SODIUM 8.6-50 MG PO TABS: 1.0000 | ORAL_TABLET | Freq: Every evening | ORAL | Status: DC | PRN

## 2016-07-09 MED ORDER — SUCCINYLCHOLINE CHLORIDE 20 MG/ML IJ SOLN
INTRAMUSCULAR | Status: AC
  Filled 2016-07-09: qty 1

## 2016-07-09 MED ORDER — ONDANSETRON HCL 4 MG/2ML IJ SOLN
4.0000 mg | Freq: Once | INTRAMUSCULAR | Status: AC
  Administered 2016-07-09: 4 mg via INTRAVENOUS

## 2016-07-09 MED ORDER — PROMETHAZINE HCL 25 MG/ML IJ SOLN
12.5000 mg | INTRAMUSCULAR | Status: DC | PRN
  Administered 2016-07-09: 12.5 mg via INTRAVENOUS
  Filled 2016-07-09: qty 1

## 2016-07-09 MED ORDER — CEFAZOLIN SODIUM-DEXTROSE 2-4 GM/100ML-% IV SOLN
INTRAVENOUS | Status: AC
  Filled 2016-07-09: qty 100

## 2016-07-09 MED ORDER — MIDAZOLAM HCL 2 MG/2ML IJ SOLN
1.0000 mg | INTRAMUSCULAR | Status: AC
  Administered 2016-07-09 (×2): 2 mg via INTRAVENOUS
  Filled 2016-07-09: qty 2

## 2016-07-09 MED ORDER — FLUTICASONE PROPIONATE 50 MCG/ACT NA SUSP: 1.0000 | Freq: Every day | NASAL | Status: DC

## 2016-07-09 MED ORDER — SODIUM CHLORIDE 0.9 % IV SOLN
8.0000 mg | Freq: Four times a day (QID) | INTRAVENOUS | Status: DC | PRN
  Administered 2016-07-10: 8 mg via INTRAVENOUS
  Filled 2016-07-09 (×2): qty 4

## 2016-07-09 MED ORDER — SUCCINYLCHOLINE CHLORIDE 20 MG/ML IJ SOLN
INTRAMUSCULAR | Status: DC | PRN
  Administered 2016-07-09: 120 mg via INTRAVENOUS

## 2016-07-09 MED ORDER — PANTOPRAZOLE SODIUM 40 MG PO TBEC
40.0000 mg | DELAYED_RELEASE_TABLET | Freq: Every day | ORAL | Status: DC
  Filled 2016-07-09: qty 1

## 2016-07-09 MED ORDER — BUPIVACAINE-EPINEPHRINE (PF) 0.5% -1:200000 IJ SOLN
INTRAMUSCULAR | Status: DC | PRN
  Administered 2016-07-09: 17 mL

## 2016-07-09 MED ORDER — ALUM & MAG HYDROXIDE-SIMETH 200-200-20 MG/5ML PO SUSP: 30.0000 mL | ORAL | Status: DC | PRN

## 2016-07-09 MED ORDER — CEFAZOLIN SODIUM-DEXTROSE 2-4 GM/100ML-% IV SOLN
2.0000 g | INTRAVENOUS | Status: AC
  Administered 2016-07-09: 2 g via INTRAVENOUS

## 2016-07-09 MED ORDER — KCL IN DEXTROSE-NACL 20-5-0.45 MEQ/L-%-% IV SOLN
INTRAVENOUS | Status: DC
  Administered 2016-07-09 – 2016-07-10 (×3): via INTRAVENOUS

## 2016-07-09 MED ORDER — MIDAZOLAM HCL 2 MG/2ML IJ SOLN
INTRAMUSCULAR | Status: AC
  Filled 2016-07-09: qty 2

## 2016-07-09 MED ORDER — PROPOFOL 10 MG/ML IV BOLUS
INTRAVENOUS | Status: DC | PRN
  Administered 2016-07-09: 200 mg via INTRAVENOUS

## 2016-07-09 SURGICAL SUPPLY — 36 items
APPLIER CLIP 13 LRG OPEN (CLIP)
BAG HAMPER (MISCELLANEOUS) ×4 IMPLANT
BLADE SURG SZ10 CARB STEEL (BLADE) ×4 IMPLANT
CLIP APPLIE 13 LRG OPEN (CLIP) IMPLANT
CLOTH BEACON ORANGE TIMEOUT ST (SAFETY) ×4 IMPLANT
COVER LIGHT HANDLE STERIS (MISCELLANEOUS) ×8 IMPLANT
DECANTER SPIKE VIAL GLASS SM (MISCELLANEOUS) ×4 IMPLANT
DRAPE PROXIMA HALF (DRAPES) ×4 IMPLANT
DRAPE STERI URO 9X17 APER PCH (DRAPES) ×4 IMPLANT
ELECT REM PT RETURN 9FT ADLT (ELECTROSURGICAL) ×4
ELECTRODE REM PT RTRN 9FT ADLT (ELECTROSURGICAL) ×2 IMPLANT
FORMALIN 10 PREFIL 480ML (MISCELLANEOUS) ×4 IMPLANT
GLOVE BIOGEL PI IND STRL 7.0 (GLOVE) ×2 IMPLANT
GLOVE BIOGEL PI IND STRL 8 (GLOVE) ×4 IMPLANT
GLOVE BIOGEL PI INDICATOR 7.0 (GLOVE) ×2
GLOVE BIOGEL PI INDICATOR 8 (GLOVE) ×4
GLOVE ECLIPSE 8.0 STRL XLNG CF (GLOVE) ×4 IMPLANT
GOWN STRL REUS W/TWL LRG LVL3 (GOWN DISPOSABLE) ×8 IMPLANT
GOWN STRL REUS W/TWL XL LVL3 (GOWN DISPOSABLE) ×4 IMPLANT
IV NS IRRIG 3000ML ARTHROMATIC (IV SOLUTION) ×4 IMPLANT
KIT ROOM TURNOVER AP CYSTO (KITS) ×4 IMPLANT
MANIFOLD NEPTUNE II (INSTRUMENTS) ×4 IMPLANT
NEEDLE HYPO 22GX1.5 SAFETY (NEEDLE) ×4 IMPLANT
NS IRRIG 1000ML POUR BTL (IV SOLUTION) ×4 IMPLANT
PACK PERI GYN (CUSTOM PROCEDURE TRAY) ×4 IMPLANT
PAD ARMBOARD 7.5X6 YLW CONV (MISCELLANEOUS) ×4 IMPLANT
SET BASIN LINEN APH (SET/KITS/TRAYS/PACK) ×4 IMPLANT
SUT MNCRL+ AB 3-0 CT1 36 (SUTURE) ×2 IMPLANT
SUT MON AB 3-0 SH 27 (SUTURE) IMPLANT
SUT MONOCRYL AB 3-0 CT1 36IN (SUTURE) ×2
SUT VIC AB 0 CT1 27 (SUTURE) ×6
SUT VIC AB 0 CT1 27XCR 8 STRN (SUTURE) ×6 IMPLANT
SYR CONTROL 10ML LL (SYRINGE) ×4 IMPLANT
TRAY FOLEY W/METER SILVER 16FR (SET/KITS/TRAYS/PACK) ×4 IMPLANT
VERSALIGHT (MISCELLANEOUS) ×4 IMPLANT
WATER STERILE IRR 1000ML POUR (IV SOLUTION) ×4 IMPLANT

## 2016-07-09 NOTE — Anesthesia Preprocedure Evaluation (Signed)
Anesthesia Evaluation  Patient identified by MRN, date of birth, ID band Patient awake    Reviewed: Allergy & Precautions, NPO status , Patient's Chart, lab work & pertinent test results  Airway Mallampati: II  TM Distance: >3 FB     Dental  (+) Teeth Intact   Pulmonary neg pulmonary ROS, former smoker,    breath sounds clear to auscultation       Cardiovascular negative cardio ROS   Rhythm:Regular Rate:Normal     Neuro/Psych PSYCHIATRIC DISORDERS Anxiety Depression    GI/Hepatic GERD  Controlled,  Endo/Other  Hypothyroidism   Renal/GU      Musculoskeletal   Abdominal   Peds  Hematology   Anesthesia Other Findings   Reproductive/Obstetrics                             Anesthesia Physical Anesthesia Plan  ASA: II  Anesthesia Plan: General   Post-op Pain Management:    Induction: Intravenous, Rapid sequence and Cricoid pressure planned  Airway Management Planned: Oral ETT  Additional Equipment:   Intra-op Plan:   Post-operative Plan: Extubation in OR  Informed Consent: I have reviewed the patients History and Physical, chart, labs and discussed the procedure including the risks, benefits and alternatives for the proposed anesthesia with the patient or authorized representative who has indicated his/her understanding and acceptance.     Plan Discussed with:   Anesthesia Plan Comments:         Anesthesia Quick Evaluation

## 2016-07-09 NOTE — Op Note (Signed)
Preoperative diagnosis:  1.  Dysmenorrhea                                         2.  Menorrhagia with regular cycle                                         3.  Deep dyspareunia(bump on exam)                                           Postoperative diagnosis:  Same as above   Procedure:  Vaginal hysterectomy with removal of both Fallopian tubes   Surgeon:  Lazaro Arms MD  Anesthesia:  General Endotracheal    Findings: Galvin Proffer a 36 y.o.G4P0 with No LMP recorded.admitted for a TVH due to dysmenorrhea bump dyspareunia and chronic cramping.  Despite stopping her bleeding she has ontinue to have cramping and intercourse pain Not lateral, if normal will preserve ovaries Sonogram is normal  She continues to have pain even on the megestrol   Intraoperative findings were unremarkable, maybe adenomyosis, the ovaries were normal  Description of operation:  The patient was taken from the preoperative area to the operating room in stable condition. She was placed in the sitting position and underwent a spinal anesthetic. Once an adequate level of anesthesia was attained she was placed in the dorsal lithotomy position. Patient was prepped and draped in the usual sterile fashion and a Foley catheter was placed.  A weighted speculum was placed and the cervix was grasped with thyroid tenaculums both anteriorly and posteriorly.  0.5% Marcaine plain was injected in a circumferential fashion about the cervix and the electrocautery unit was used to incise the vagina and push at all cervix.  The posterior cul-de-sac was then entered sharply without difficulty.  The uterosacral ligaments were clamped cut and inspection suture ligated and held.  The cardinal ligaments were then clamped cut transfixion suture ligated and cut. The anterior peritoneum was identified the anterior cul-de-sac was entered sharply without difficulty. The anterior and posterior leaves of the broad ligament were plicated and the  uterine vessels were clamped cut and suture ligated. Serial pedicles were taken of the fundus with each pedicle being clamped cut and suture ligated. The utero-ovarian ligaments were crossclamped the uterus was removed and both pedicles were transfixion suture ligated. There was good hemostasis of all the pedicles. The Fallopian tubes were then cross clamped bilaterally and fore and aft placed with good hemostasis, the ovaries were thus preserved.   The peritoneum was then closed in a pursestring fashion using 3-0 Vicryl. The anterior posterior vagina was closed in interrupted fashion with good resultant hemostasis. Our closure the lower pelvis and vagina were irrigated vigorously.  The sponge needle and instrument counts were correct x 3.  Total blood loss for the procedure was 100 cc.  The patient received 1 g of Ancef IV preoperatively prophylactically.  She was taken to the recovery room in good stable condition awake alert doing well.  Ladasha Schnackenberg H 07/09/2016, 9:57 AM

## 2016-07-09 NOTE — Interval H&P Note (Signed)
History and Physical Interval Note:  07/09/2016 8:01 AM  Aimee Price  has presented today for surgery, with the diagnosis of Dysmenorrhea Dyspareunia Menorrhagia  The various methods of treatment have been discussed with the patient and family. After consideration of risks, benefits and other options for treatment, the patient has consented to  Procedure(s): HYSTERECTOMY VAGINAL (N/A) as a surgical intervention .  The patient's history has been reviewed, patient examined, no change in status, stable for surgery.  I have reviewed the patient's chart and labs.  Questions were answered to the patient's satisfaction.     Aimee Price H

## 2016-07-09 NOTE — H&P (Signed)
Preoperative History and Physical  Aimee Price a 36 y.o.G4P0 with No LMP recorded.admitted for a TVH due to dysmenorrhea bump dyspareunia and chronic cramping.  Despite stopping her bleeding she has ontinue to have cramping and intercourse pain Not lateral, if normal will preserve ovaries Sonogram is normal  She continues to have pain even on the megestrol  PMH:      Past Medical History:  Diagnosis Date  . Depression   . Ovarian cyst   . Pneumothorax     PSH:       Past Surgical History:  Procedure Laterality Date  . CARDIAC VALVE SURGERY    . CATARACT EXTRACTION    . leap procedure    . lung procedure     procedure for collapsed lung  . right foot screws     . tubal ligations      POb/GynH:          OB History   Gravida Para Term Preterm AB Living   4        SAB TAB Ectopic Multiple Live Births             SH:        Social History  Substance Use Topics  . Smoking status: Former Smoker    Quit date: 08/08/2015  . Smokeless tobacco: Current User    Types: Chew  . Alcohol use Yes      Comment: occasionally    FH:       Family History  Problem Relation Age of Onset  . Adopted: Yes  . Endometriosis Mother      Allergies:      Allergies  Allergen Reactions  . Bee Venom Anaphylaxis    Pt states was a small child and can not remember allergy reaction  . Ibuprofen Hives    Medications:  Current Outpatient Prescriptions:  . levothyroxine (SYNTHROID, LEVOTHROID) 50 MCG tablet, Take 150 mcg by mouth daily before breakfast. , Disp: , Rfl:  . megestrol (MEGACE) 40 MG tablet, Take 1 tablet (40 mg total) by mouth daily., Disp: 30 tablet, Rfl: 11 . Multiple Vitamin (MULTIVITAMIN WITH MINERALS) TABS tablet, Take 1 tablet by mouth daily., Disp: , Rfl:  . omeprazole (PRILOSEC) 20 MG capsule, Take 1 capsule (20 mg total) by mouth daily. 1 tablet  a day, Disp: 30 capsule, Rfl: 6  Review of Systems:  Review of Systems  Constitutional: Negative for fever, chills, weight loss, malaise/fatigue and diaphoresis.  HENT: Negative for hearing loss, ear pain, nosebleeds, congestion, sore throat, neck pain, tinnitus and ear discharge.  Eyes: Negative for blurred vision, double vision, photophobia, pain, discharge and redness.  Respiratory: Negative for cough, hemoptysis, sputum production, shortness of breath, wheezing and stridor.  Cardiovascular: Negative for chest pain, palpitations, orthopnea, claudication, leg swelling and PND.  Gastrointestinal: Negative for abdominal pain. Negative for heartburn, nausea, vomiting, diarrhea, constipation, blood in stool and melena.  Genitourinary: Negative for dysuria, urgency, frequency, hematuria and flank pain.  Musculoskeletal: Negative for myalgias, back pain, joint pain and falls.  Skin: Negative for itching and rash.  Neurological: Negative for dizziness, tingling, tremors, sensory change, speech change, focal weakness, seizures, loss of consciousness, weakness and headaches.  Endo/Heme/Allergies: Negative for environmental allergies and polydipsia. Does not bruise/bleed easily.  Psychiatric/Behavioral: Negative for depression, suicidal ideas, hallucinations, memory loss and substance abuse. The patient is not nervous/anxious and does not have insomnia.     PHYSICAL EXAM:  Blood pressure 108/78, pulse 79, weight 143 lb (64.9 kg).  Vitals reviewed. Constitutional: She is oriented to person, place, and time. She appears well-developed and well-nourished.  HENT:  Head: Normocephalic and atraumatic.  Right Ear: External ear normal.  Left Ear: External ear normal.  Nose: Nose normal.  Mouth/Throat: Oropharynx is clear and moist.  Eyes: Conjunctivae and EOM are normal. Pupils are equal, round, and reactive to light. Right eye exhibits no discharge. Left eye exhibits no discharge. No  scleral icterus.  Neck: Normal range of motion. Neck supple. No tracheal deviation present. No thyromegaly present.  Cardiovascular: Normal rate, regular rhythm, normal heart sounds and intact distal pulses. Exam reveals no gallop and no friction rub.  No murmur heard. Respiratory: Effort normal and breath sounds normal. No respiratory distress. She has no wheezes. She has no rales. She exhibits no tenderness.  GI: Soft. Bowel sounds are normal. She exhibits no distension and no mass. There is tenderness. There is no rebound and no guarding.  Genitourinary:  Vulva is normal without lesions Vagina is pink moist without discharge Cervix normal in appearance and pap is normal Uterus is NSSC tender on exam Adnexa is negative with normal sized ovaries by sonogram Musculoskeletal: Normal range of motion. She exhibits no edema and no tenderness.  Neurological: She is alert and oriented to person, place, and time. She has normal reflexes. She displays normal reflexes. No cranial nerve deficit. She exhibits normal muscle tone. Coordination normal.  Skin: Skin is warm and dry. No rash noted. No erythema. No pallor.  Psychiatric: She has a normal mood and affect. Her behavior is normal. Judgment and thought content normal.    Labs: No results found for this or any previous visit (from the past 336 hour(s)).  EKG: No orders found for this or any previous visit.  Imaging Studies: ImagingResults  No results found.      Assessment: Dysmenorrhea  Menorrhagia with regular cycle  Deep dyspareunia  (no lateral pain)    Plan: TVH, preserve ovaries unless there is some unexpected issue, remove her tubes if possible for ovarian cancer prophylaxis  Pt understands the risks of surgery including but not limited t excessive bleeding requiring transfusion or reoperation, post-operative infection requiring prolonged hospitalization or re-hospitalization and antibiotic  therapy, and damage to other organs including bladder, bowel, ureters and major vessels. The patient also understands the alternative treatment options which were discussed in full. All questions were answered.   Yago Ludvigsen H 06/19/2016 10:46 AM

## 2016-07-09 NOTE — Transfer of Care (Signed)
Immediate Anesthesia Transfer of Care Note  Patient: Aimee Price  Procedure(s) Performed: Procedure(s): HYSTERECTOMY VAGINAL (N/A) BILATERAL SALPINGECTOMY (Bilateral)  Patient Location: PACU  Anesthesia Type:General  Level of Consciousness: awake, alert  and oriented  Airway & Oxygen Therapy: Patient Spontanous Breathing and Patient connected to face mask oxygen  Post-op Assessment: Report given to RN and Post -op Vital signs reviewed and stable  Post vital signs: Reviewed and stable  Last Vitals:  Vitals:   07/09/16 1030 07/09/16 1121  BP: 128/82 126/86  Pulse: 98 85  Resp: 20   Temp:  36.9 C    Last Pain:  Vitals:   07/09/16 1122  TempSrc:   PainSc: 6       Patients Stated Pain Goal: 3 (07/09/16 1123)  Complications: No apparent anesthesia complications

## 2016-07-09 NOTE — Anesthesia Procedure Notes (Signed)
Procedure Name: Intubation Date/Time: 07/09/2016 8:33 AM Performed by: Jonna Munro Pre-anesthesia Checklist: Patient identified, Emergency Drugs available, Suction available, Patient being monitored and Timeout performed Patient Re-evaluated:Patient Re-evaluated prior to inductionOxygen Delivery Method: Circle system utilized Preoxygenation: Pre-oxygenation with 100% oxygen Intubation Type: IV induction, Cricoid Pressure applied and Rapid sequence Laryngoscope Size: Mac and 3 Grade View: Grade I Tube type: Oral Tube size: 7.0 mm Number of attempts: 1 Airway Equipment and Method: Stylet Secured at: 22 cm Tube secured with: Tape Dental Injury: Teeth and Oropharynx as per pre-operative assessment

## 2016-07-09 NOTE — Anesthesia Postprocedure Evaluation (Signed)
Anesthesia Post Note  Patient: Dory Larsen  Procedure(s) Performed: Procedure(s) (LRB): HYSTERECTOMY VAGINAL (N/A) BILATERAL SALPINGECTOMY (Bilateral)  Patient location during evaluation: Nursing Unit Anesthesia Type: General Level of consciousness: awake and alert Pain management: satisfactory to patient Vital Signs Assessment: post-procedure vital signs reviewed and stable Respiratory status: spontaneous breathing Cardiovascular status: stable Anesthetic complications: no     Last Vitals:  Vitals:   07/09/16 1030 07/09/16 1121  BP: 128/82 126/86  Pulse: 98 85  Resp: 20   Temp:  36.9 C    Last Pain:  Vitals:   07/09/16 1203  TempSrc:   PainSc: Asleep                 Kavontae Pritchard

## 2016-07-09 NOTE — Transfer of Care (Signed)
Immediate Anesthesia Transfer of Care Note  Patient: Aimee Price  Procedure(s) Performed: Procedure(s): HYSTERECTOMY VAGINAL (N/A) REMOVE TUBES IF POSSIBLE  Patient Location: PACU  Anesthesia Type:General  Level of Consciousness: awake, alert  and oriented  Airway & Oxygen Therapy: Patient Spontanous Breathing and Patient connected to nasal cannula oxygen  Post-op Assessment: Report given to RN and Post -op Vital signs reviewed and stable  Post vital signs: Reviewed and stable  Last Vitals:  Vitals:   07/09/16 0810 07/09/16 0815  BP: 127/87 122/89  Pulse:    Resp: 20 (!) 22  Temp:      Last Pain:  Vitals:   07/09/16 0718  TempSrc: Oral      Patients Stated Pain Goal: 7 (07/09/16 0718)  Complications: No apparent anesthesia complications

## 2016-07-09 NOTE — Anesthesia Postprocedure Evaluation (Signed)
Anesthesia Post Note  Patient: Aimee Price  Procedure(s) Performed: Procedure(s) (LRB): HYSTERECTOMY VAGINAL (N/A) BILATERAL SALPINGECTOMY (Bilateral)  Patient location during evaluation: Other Anesthesia Type: General Level of consciousness: awake, oriented and awake and alert Pain management: pain level controlled Vital Signs Assessment: post-procedure vital signs reviewed and stable Respiratory status: spontaneous breathing, nonlabored ventilation, respiratory function stable and patient connected to nasal cannula oxygen Cardiovascular status: stable Postop Assessment: no signs of nausea or vomiting and adequate PO intake Anesthetic complications: no     Last Vitals:  Vitals:   07/09/16 1121 07/09/16 1300  BP: 126/86 116/68  Pulse: 85 68  Resp:  18  Temp: 36.9 C 36.4 C    Last Pain:  Vitals:   07/09/16 1300  TempSrc: Axillary  PainSc:                  Madison Hickman

## 2016-07-10 ENCOUNTER — Encounter (HOSPITAL_COMMUNITY): Payer: Self-pay | Admitting: Obstetrics & Gynecology

## 2016-07-10 DIAGNOSIS — N72 Inflammatory disease of cervix uteri: Secondary | ICD-10-CM | POA: Diagnosis not present

## 2016-07-10 LAB — CBC
HEMATOCRIT: 39.7 % (ref 36.0–46.0)
HEMOGLOBIN: 13.7 g/dL (ref 12.0–15.0)
MCH: 31.3 pg (ref 26.0–34.0)
MCHC: 34.5 g/dL (ref 30.0–36.0)
MCV: 90.6 fL (ref 78.0–100.0)
Platelets: 190 10*3/uL (ref 150–400)
RBC: 4.38 MIL/uL (ref 3.87–5.11)
RDW: 12.5 % (ref 11.5–15.5)
WBC: 10.4 10*3/uL (ref 4.0–10.5)

## 2016-07-10 LAB — BASIC METABOLIC PANEL
Anion gap: 7 (ref 5–15)
CHLORIDE: 109 mmol/L (ref 101–111)
CO2: 22 mmol/L (ref 22–32)
CREATININE: 0.85 mg/dL (ref 0.44–1.00)
Calcium: 8.8 mg/dL — ABNORMAL LOW (ref 8.9–10.3)
GFR calc Af Amer: >60 mL/min (ref >60)
GFR calc non Af Amer: >60 mL/min (ref >60)
GLUCOSE: 166 mg/dL — AB (ref 65–99)
Potassium: 4 mmol/L (ref 3.5–5.1)
SODIUM: 138 mmol/L (ref 135–145)

## 2016-07-10 MED ORDER — DOCUSATE SODIUM 100 MG PO CAPS: 100.0000 mg | ORAL_CAPSULE | Freq: Two times a day (BID) | ORAL | 0 refills | Status: AC

## 2016-07-10 MED ORDER — ONDANSETRON HCL 4 MG/2ML IJ SOLN
INTRAMUSCULAR | Status: AC
  Filled 2016-07-10: qty 4

## 2016-07-10 MED ORDER — CIPROFLOXACIN HCL 500 MG PO TABS: 500.0000 mg | ORAL_TABLET | Freq: Two times a day (BID) | ORAL | 0 refills | Status: AC

## 2016-07-10 MED ORDER — CETIRIZINE HCL 10 MG PO TABS: 10.0000 mg | ORAL_TABLET | Freq: Every day | ORAL | 1 refills | Status: AC

## 2016-07-10 MED ORDER — OXYCODONE-ACETAMINOPHEN 5-325 MG PO TABS: 1.0000 | ORAL_TABLET | ORAL | 0 refills | Status: DC | PRN

## 2016-07-10 NOTE — Progress Notes (Signed)
Patient states understanding of discharge, prescription given 

## 2016-07-10 NOTE — Progress Notes (Signed)
Clarification to pharmacy.  Patient received 1 mg of Dilaudid @ 02:34 and @ 04:43. When taken out of [facility] it asked to waste, but when scanned pt's barcode it did not ask to waste. Waste was entered in [place], but patient actually received correct amount each time. 1 mg.

## 2016-07-10 NOTE — Discharge Instructions (Signed)
Vaginal Hysterectomy, Care After °Refer to this sheet in the next few weeks. These instructions provide you with information about caring for yourself after your procedure. Your health care provider may also give you more specific instructions. Your treatment has been planned according to current medical practices, but problems sometimes occur. Call your health care provider if you have any problems or questions after your procedure. °What can I expect after the procedure? °After the procedure, it is common to have: °· Pain. °· Soreness and numbness in your incision areas. °· Vaginal bleeding and discharge. °· Constipation. °· Temporary problems emptying the bladder. °· Feelings of sadness or other emotions. °Follow these instructions at home: °Medicines  °· Take over-the-counter and prescription medicines only as told by your health care provider. °· If you were prescribed an antibiotic medicine, take it as told by your health care provider. Do not stop taking the antibiotic even if you start to feel better. °· Do not drive or operate heavy machinery while taking prescription pain medicine. °Activity  °· Return to your normal activities as told by your health care provider. Ask your health care provider what activities are safe for you. °· Get regular exercise as told by your health care provider. You may be told to take short walks every day and go farther each time. °· Do not lift anything that is heavier than 10 lb (4.5 kg). °General instructions  ° °· Do not put anything in your vagina for 6 weeks after your surgery or as told by your health care provider. This includes tampons and douches. °· Do not have sex until your health care provider says you can. °· Do not take baths, swim, or use a hot tub until your health care provider approves. °· Drink enough fluid to keep your urine clear or pale yellow. °· Do not drive for 24 hours if you were given a sedative. °· Keep all follow-up visits as told by your health  care provider. This is important. °Contact a health care provider if: °· Your pain medicine is not helping. °· You have a fever. °· You have redness, swelling, or pain at your incision site. °· You have blood, pus, or a bad-smelling discharge from your vagina. °· You continue to have difficulty urinating. °Get help right away if: °· You have severe abdominal or back pain. °· You have heavy bleeding from your vagina. °· You have chest pain or shortness of breath. °This information is not intended to replace advice given to you by your health care provider. Make sure you discuss any questions you have with your health care provider. °Document Released: 07/09/2015 Document Revised: 08/23/2015 Document Reviewed: 04/01/2015 °Elsevier Interactive Patient Education © 2017 [employer] ° °

## 2016-07-10 NOTE — Care Management Note (Signed)
Case Management Note  Patient Details  Name: Aimee Price MRN: [id number] Date of Birth: [birth year]  Subjective/Objective:                  Pt admitted s/p vag hysterectomy. Chart reviewed for CM needs. Pt is from home, lives with family and is ind with ADL's. She has PCP, transportation and insurance with drug coverage. Pt will f/u with GYN.   Action/Plan: Discharging home today with self care.   Expected Discharge Date:  07/10/16               Expected Discharge Plan:  Home/Self Care  In-House Referral:  NA  Discharge planning Services  CM Consult  Post Acute Care Choice:  NA Choice offered to:  NA  Status of Service:  Completed, signed off  Malcolm Metro, RN 07/10/2016, 2:32 PM

## 2016-07-10 NOTE — Discharge Summary (Signed)
Physician Discharge Summary  Patient ID: Aimee Price MRN: [id number] DOB/AGE: Mar 19, 1981 36 y.o.  Admit date: 07/09/2016 Discharge date: 07/10/2016  Admission Diagnoses: Dysmenorrhea, dyspareunia  Discharge Diagnoses:  Active Problems:   S/P vaginal hysterectomy   Discharged Condition: good  Hospital Course: unremarkable post operative course  Consults: None  Significant Diagnostic Studies: labs:   Treatments: surgery: TVH with removal of Fallopian tubes  Discharge Exam: Blood pressure 112/63, pulse 76, temperature 98.9 F (37.2 C), temperature source Oral, resp. rate 18, height  (1.549 m), weight 141 lb 15.6 oz (64.4 kg), last menstrual period 02/13/2016, SpO2 97 %. General appearance: alert, cooperative and no distress GI: soft, non-tender; bowel sounds normal; no masses,  no organomegaly  Disposition: 01-Home or Self Care  Discharge Instructions    Call MD for:  persistant nausea and vomiting    Complete by:  As directed    Call MD for:  severe uncontrolled pain    Complete by:  As directed    Call MD for:  temperature >100.4    Complete by:  As directed    Diet - low sodium heart healthy    Complete by:  As directed    Driving Restrictions    Complete by:  As directed    No driving for 1 week   Increase activity slowly    Complete by:  As directed    Lifting restrictions    Complete by:  As directed    Do not lift more than 10 pounds   No wound care    Complete by:  As directed    Sexual Activity Restrictions    Complete by:  As directed    No sex until instructed by Dr Despina Hidden     Allergies as of 07/10/2016      Reactions   Bee Venom Anaphylaxis   Pt states was a small child and can not remember allergy reaction   Ibuprofen Hives   Morphine And Related Nausea And Vomiting      Medication List    STOP taking these medications   megestrol 40 MG tablet Commonly known as:  MEGACE     TAKE these medications   cetirizine 10 MG tablet Commonly  known as:  ZYRTEC ALLERGY Take 1 tablet (10 mg total) by mouth daily.   ciprofloxacin 500 MG tablet Commonly known as:  CIPRO Take 1 tablet (500 mg total) by mouth 2 (two) times daily.   docusate sodium 100 MG capsule Commonly known as:  COLACE Take 1 capsule (100 mg total) by mouth 2 (two) times daily.   fluticasone 50 MCG/ACT nasal spray Commonly known as:  FLONASE Place 1 spray into both nostrils daily.   levothyroxine 150 MCG tablet Commonly known as:  SYNTHROID, LEVOTHROID Take 150 mcg by mouth daily.   multivitamin with minerals Tabs tablet Take 1 tablet by mouth daily.   omeprazole 20 MG capsule Commonly known as:  PRILOSEC Take 1 capsule (20 mg total) by mouth daily. 1 tablet a day What changed:  when to take this  reasons to take this  additional instructions   oxyCODONE-acetaminophen 5-325 MG tablet Commonly known as:  PERCOCET/ROXICET Take 1-2 tablets by mouth every 4 (four) hours as needed (moderate to severe pain (when tolerating fluids)).   vitamin C 500 MG tablet Commonly known as:  ASCORBIC ACID Take 500 mg by mouth daily.        SignedLazaro Arms 07/10/2016, 2:27 PM

## 2016-07-17 ENCOUNTER — Ambulatory Visit (INDEPENDENT_AMBULATORY_CARE_PROVIDER_SITE_OTHER): Payer: Medicaid Other | Admitting: Obstetrics & Gynecology

## 2016-07-17 ENCOUNTER — Encounter: Payer: Self-pay | Admitting: Obstetrics & Gynecology

## 2016-07-17 VITALS — BP 110/74 | HR 88 | Wt 138.0 lb

## 2016-07-17 DIAGNOSIS — Z90710 Acquired absence of both cervix and uterus: Secondary | ICD-10-CM

## 2016-07-17 NOTE — Progress Notes (Signed)
  HPI: Patient returns for routine postoperative follow-up having undergone TVH + removal of tubes on 07/09/2016.  The patient's immediate postoperative recovery has been unremarkable. Since hospital discharge the patient reports no problems.   Current Outpatient Prescriptions: cetirizine (ZYRTEC ALLERGY) 10 MG tablet, Take 1 tablet (10 mg total) by mouth daily., Disp: 30 tablet, Rfl: 1 ciprofloxacin (CIPRO) 500 MG tablet, Take 1 tablet (500 mg total) by mouth 2 (two) times daily., Disp: 14 tablet, Rfl: 0 docusate sodium (COLACE) 100 MG capsule, Take 1 capsule (100 mg total) by mouth 2 (two) times daily., Disp: 20 capsule, Rfl: 0 fluticasone (FLONASE) 50 MCG/ACT nasal spray, Place 1 spray into both nostrils daily., Disp: , Rfl:  levothyroxine (SYNTHROID, LEVOTHROID) 150 MCG tablet, Take 150 mcg by mouth daily., Disp: , Rfl: 11 Multiple Vitamin (MULTIVITAMIN WITH MINERALS) TABS tablet, Take 1 tablet by mouth daily., Disp: , Rfl:  omeprazole (PRILOSEC) 20 MG capsule, Take 1 capsule (20 mg total) by mouth daily. 1 tablet a day (Patient taking differently: Take 20 mg by mouth daily as needed. 1 tablet a day), Disp: 30 capsule, Rfl: 6 vitamin C (ASCORBIC ACID) 500 MG tablet, Take 500 mg by mouth daily., Disp: , Rfl:  oxyCODONE-acetaminophen (PERCOCET/ROXICET) 5-325 MG tablet, Take 1-2 tablets by mouth every 4 (four) hours as needed (moderate to severe pain (when tolerating fluids)). (Patient not taking: Reported on 07/17/2016), Disp: 30 tablet, Rfl: 0  No current facility-administered medications for this visit.     Blood pressure 110/74, pulse 88, weight 138 lb (62.6 kg), last menstrual period 02/20/2016.  Physical Exam: Abdomen soft normal Cuff intact no blood Bimanual no masses none tender normal post TVH exam  Diagnostic Tests:   Pathology: benign  Impression: s/p TVH removal of tubes  Plan:   Follow up: 2  weeks  Lazaro Arms, MD

## 2016-07-18 ENCOUNTER — Encounter: Payer: Self-pay | Admitting: Obstetrics & Gynecology

## 2016-07-25 ENCOUNTER — Encounter: Payer: Self-pay | Admitting: Obstetrics & Gynecology

## 2016-07-31 ENCOUNTER — Ambulatory Visit (INDEPENDENT_AMBULATORY_CARE_PROVIDER_SITE_OTHER): Payer: Medicaid Other | Admitting: Obstetrics & Gynecology

## 2016-07-31 ENCOUNTER — Encounter: Payer: Self-pay | Admitting: Obstetrics & Gynecology

## 2016-07-31 DIAGNOSIS — R300 Dysuria: Secondary | ICD-10-CM

## 2016-07-31 DIAGNOSIS — Z90710 Acquired absence of both cervix and uterus: Secondary | ICD-10-CM

## 2016-07-31 LAB — POCT URINALYSIS DIPSTICK
Blood, UA: NEGATIVE
Glucose, UA: NEGATIVE
Ketones, UA: NEGATIVE
LEUKOCYTES UA: NEGATIVE
Nitrite, UA: NEGATIVE
PROTEIN UA: NEGATIVE

## 2016-07-31 MED ORDER — METRONIDAZOLE 500 MG PO TABS: 500.0000 mg | ORAL_TABLET | Freq: Two times a day (BID) | ORAL | 0 refills | Status: DC

## 2016-07-31 NOTE — Progress Notes (Signed)
  HPI: Patient returns for routine postoperative follow-up having undergone TVH bilateral  on 07/09/2016.  The patient's immediate postoperative recovery has been unremarkable. Since hospital discharge the patient reports some discharge and spotting.   Current Outpatient Prescriptions: cetirizine (ZYRTEC ALLERGY) 10 MG tablet, Take 1 tablet (10 mg total) by mouth daily., Disp: 30 tablet, Rfl: 1 ciprofloxacin (CIPRO) 500 MG tablet, Take 1 tablet (500 mg total) by mouth 2 (two) times daily., Disp: 14 tablet, Rfl: 0 docusate sodium (COLACE) 100 MG capsule, Take 1 capsule (100 mg total) by mouth 2 (two) times daily., Disp: 20 capsule, Rfl: 0 fluticasone (FLONASE) 50 MCG/ACT nasal spray, Place 1 spray into both nostrils daily., Disp: , Rfl:  levothyroxine (SYNTHROID, LEVOTHROID) 150 MCG tablet, Take 150 mcg by mouth daily., Disp: , Rfl: 11 metroNIDAZOLE (FLAGYL) 500 MG tablet, Take 1 tablet (500 mg total) by mouth 2 (two) times daily., Disp: 14 tablet, Rfl: 0 Multiple Vitamin (MULTIVITAMIN WITH MINERALS) TABS tablet, Take 1 tablet by mouth daily., Disp: , Rfl:  omeprazole (PRILOSEC) 20 MG capsule, Take 1 capsule (20 mg total) by mouth daily. 1 tablet a day (Patient taking differently: Take 20 mg by mouth daily as needed. 1 tablet a day), Disp: 30 capsule, Rfl: 6 oxyCODONE-acetaminophen (PERCOCET/ROXICET) 5-325 MG tablet, Take 1-2 tablets by mouth every 4 (four) hours as needed (moderate to severe pain (when tolerating fluids)). (Patient not taking: Reported on 07/17/2016), Disp: 30 tablet, Rfl: 0 vitamin C (ASCORBIC ACID) 500 MG tablet, Take 500 mg by mouth daily., Disp: , Rfl:   No current facility-administered medications for this visit.     Last menstrual period 02/20/2016.  Physical Exam: Vagina with BV  Cuff healing well  Diagnostic Tests:   Pathology: benign  Impression: s/p TVH 3 weeks out  Plan: Meds ordered this encounter  Medications  . metroNIDAZOLE (FLAGYL) 500 MG tablet     Sig: Take 1 tablet (500 mg total) by mouth 2 (two) times daily.    Dispense:  14 tablet    Refill:  0   No sex RTW 08/11/2016  Follow up: 3  weeks  Lazaro ArmsEURE,LUTHER H, MD

## 2016-08-21 ENCOUNTER — Encounter: Payer: Self-pay | Admitting: Obstetrics & Gynecology

## 2016-08-21 ENCOUNTER — Ambulatory Visit (INDEPENDENT_AMBULATORY_CARE_PROVIDER_SITE_OTHER): Payer: Medicaid Other | Admitting: Obstetrics & Gynecology

## 2016-08-21 VITALS — BP 110/72 | HR 70 | Wt 139.0 lb

## 2016-08-21 DIAGNOSIS — Z90710 Acquired absence of both cervix and uterus: Secondary | ICD-10-CM

## 2016-08-21 NOTE — Progress Notes (Signed)
  HPI: Patient returns for routine postoperative follow-up having undergone TVH removal of tubes on 07/09/2016.  The patient's immediate postoperative recovery has been unremarkable. Since hospital discharge the patient reports no problems.   Current Outpatient Prescriptions: cetirizine (ZYRTEC ALLERGY) 10 MG tablet, Take 1 tablet (10 mg total) by mouth daily., Disp: 30 tablet, Rfl: 1 fluticasone (FLONASE) 50 MCG/ACT nasal spray, Place 1 spray into both nostrils daily., Disp: , Rfl:  levothyroxine (SYNTHROID, LEVOTHROID) 150 MCG tablet, Take 150 mcg by mouth daily., Disp: , Rfl: 11 Multiple Vitamin (MULTIVITAMIN WITH MINERALS) TABS tablet, Take 1 tablet by mouth daily., Disp: , Rfl:  omeprazole (PRILOSEC) 20 MG capsule, Take 1 capsule (20 mg total) by mouth daily. 1 tablet a day (Patient taking differently: Take 20 mg by mouth daily as needed. 1 tablet a day), Disp: 30 capsule, Rfl: 6 vitamin C (ASCORBIC ACID) 500 MG tablet, Take 500 mg by mouth daily., Disp: , Rfl:  ciprofloxacin (CIPRO) 500 MG tablet, Take 1 tablet (500 mg total) by mouth 2 (two) times daily. (Patient not taking: Reported on 08/21/2016), Disp: 14 tablet, Rfl: 0 docusate sodium (COLACE) 100 MG capsule, Take 1 capsule (100 mg total) by mouth 2 (two) times daily. (Patient not taking: Reported on 08/21/2016), Disp: 20 capsule, Rfl: 0  No current facility-administered medications for this visit.     Blood pressure 110/72, pulse 70, weight 139 lb (63 kg), last menstrual period 02/20/2016.  Physical Exam: Abdomen soft normal Cuff healing well  Diagnostic Tests:   Pathology: benign  Impression: S/p TVH with removal of both tubes  Plan: delat intercourse for 2 more weeks  Follow up: 1  years  Lazaro ArmsEURE,LUTHER H, MD

## 2016-11-11 ENCOUNTER — Encounter: Payer: Self-pay | Admitting: Obstetrics & Gynecology

## 2017-01-22 ENCOUNTER — Encounter: Payer: Self-pay | Admitting: Obstetrics & Gynecology

## 2017-02-24 IMAGING — US US TRANSVAGINAL NON-OB
1 series · 14 of 25 positions shown · non-contrast
Comparison: None

CLINICAL DATA: Right lower quadrant pain.

EXAM:
TRANSABDOMINAL AND TRANSVAGINAL ULTRASOUND OF PELVIS
TECHNIQUE: Both transabdominal and transvaginal ultrasound examinations of the
pelvis were performed. Transabdominal technique was performed for
global imaging of the pelvis including uterus, ovaries, adnexal
regions, and pelvic cul-de-sac. It was necessary to proceed with
endovaginal exam following the transabdominal exam to visualize the
right ovary.

[Series 1: us transvaginal non-ob · 0.18mm/px · 14 of 57 slices shown]
[im 1/57]
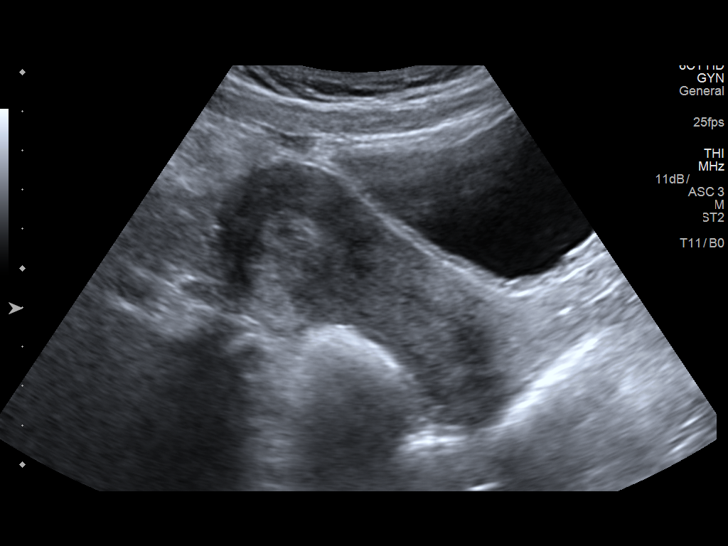
[im 5/57]
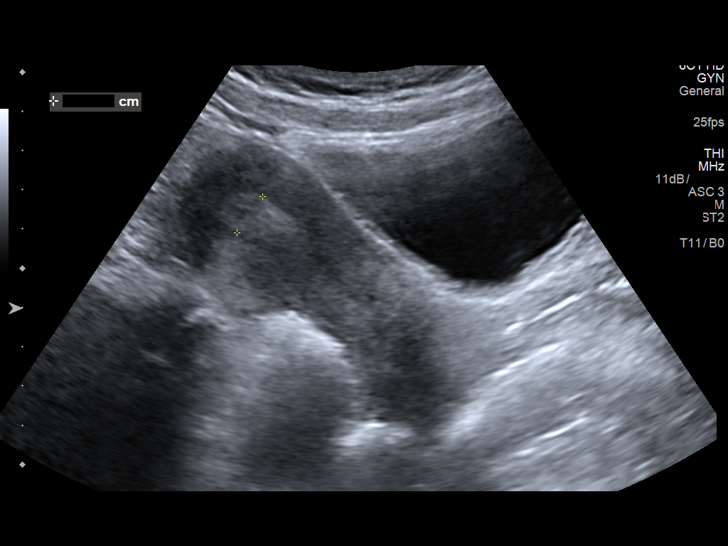
[im 10/57]
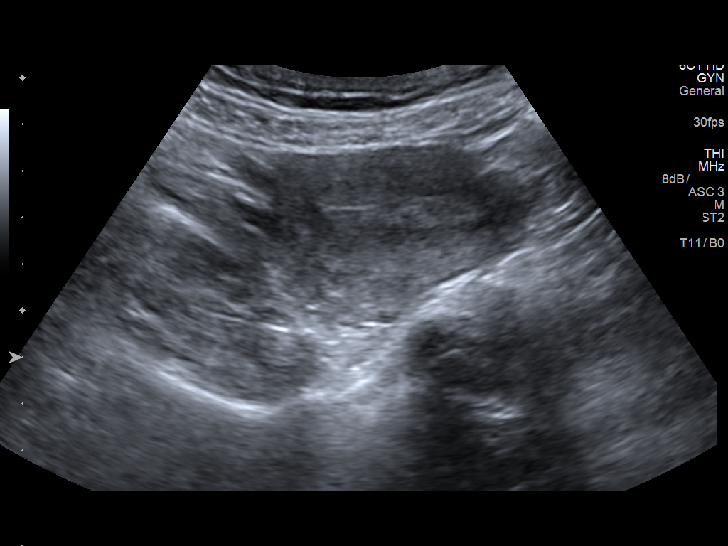
[im 15/57]
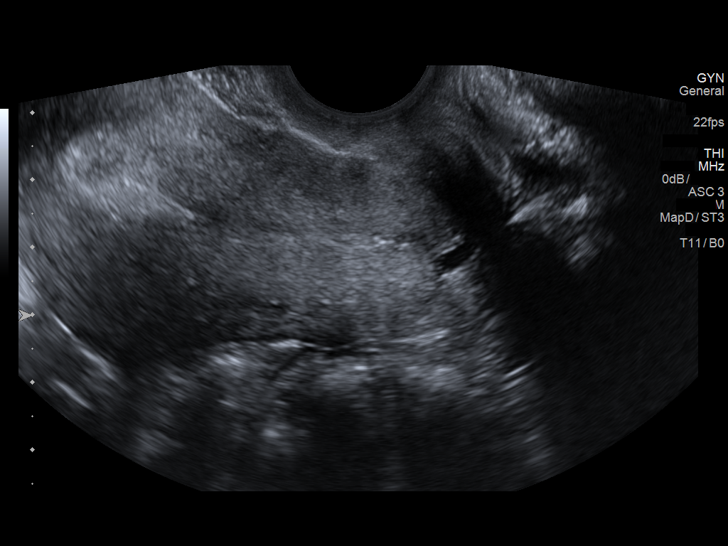
[im 19/57]
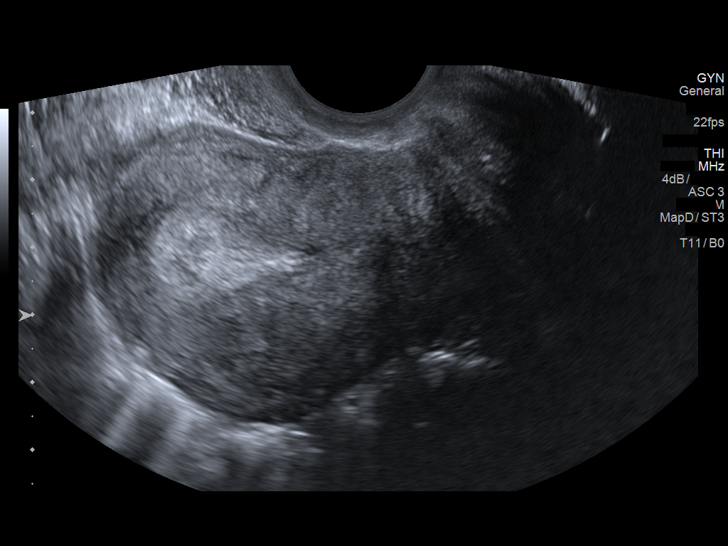
[im 22/57]
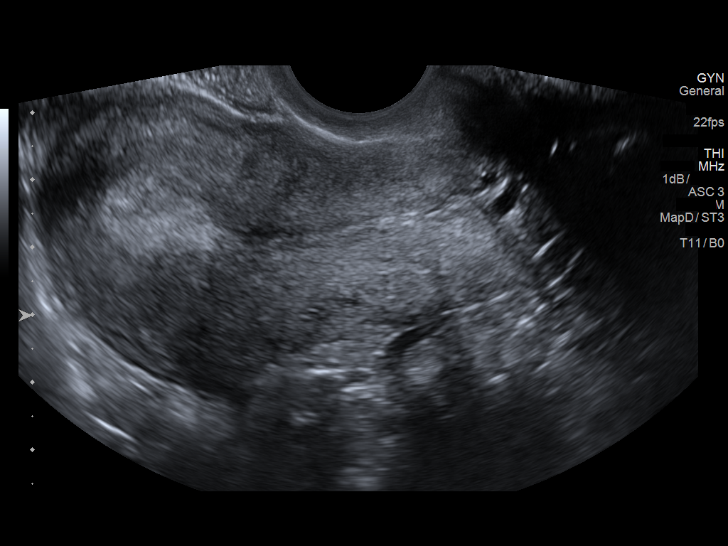
[im 26/57]
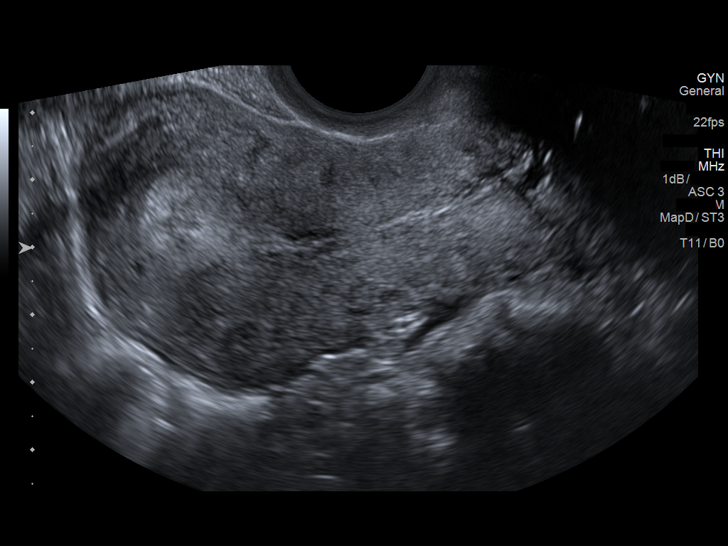
[im 31/57]
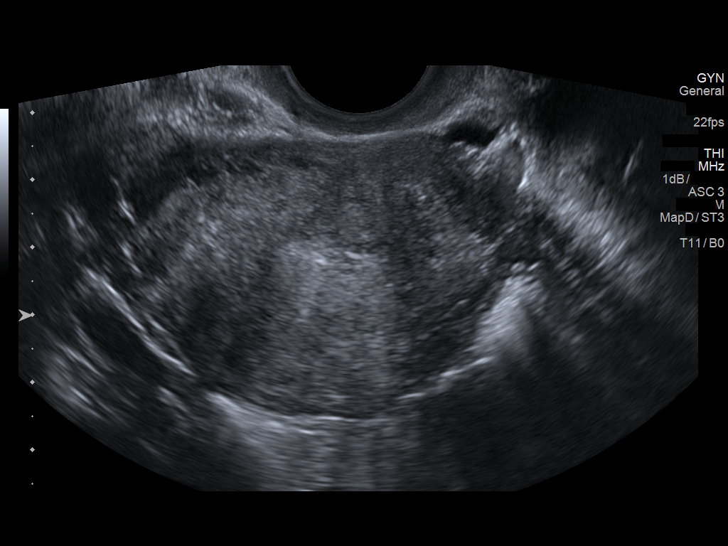
[im 36/57]
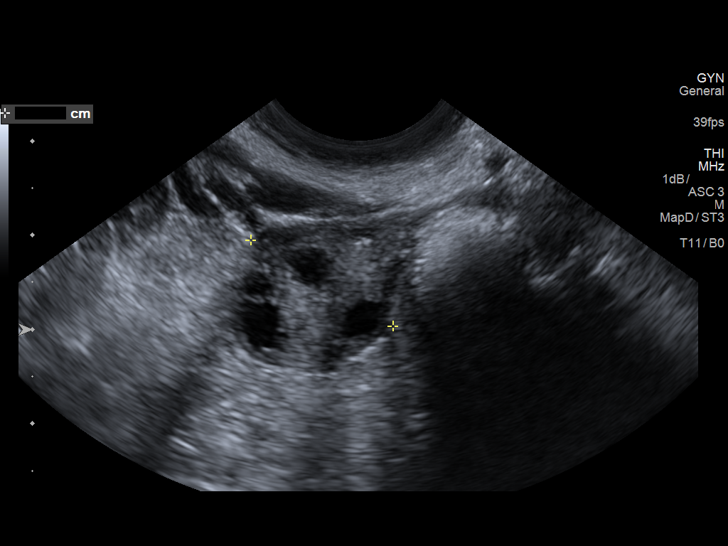
[im 38/57]
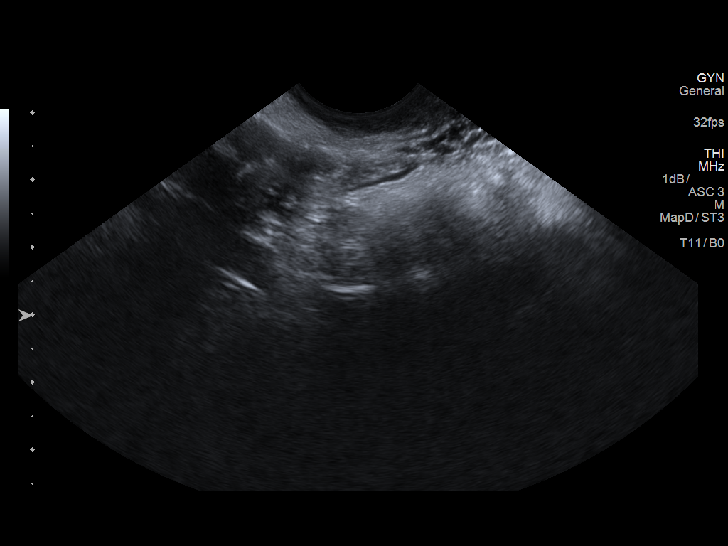
[im 43/57]
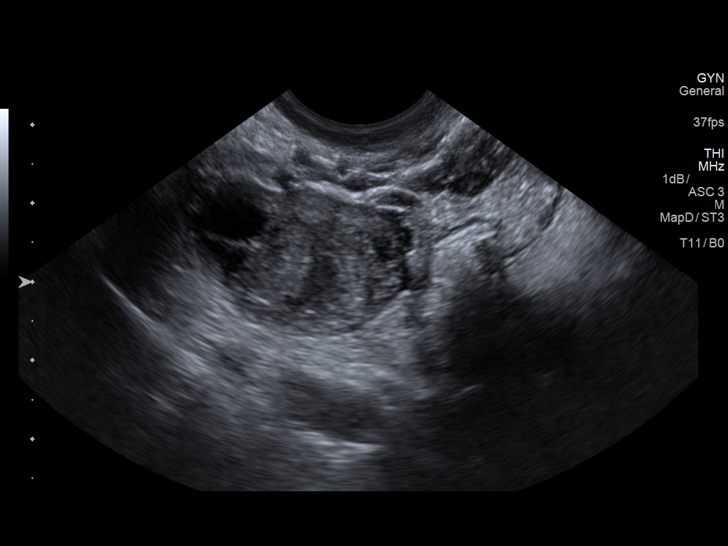
[im 47/57]
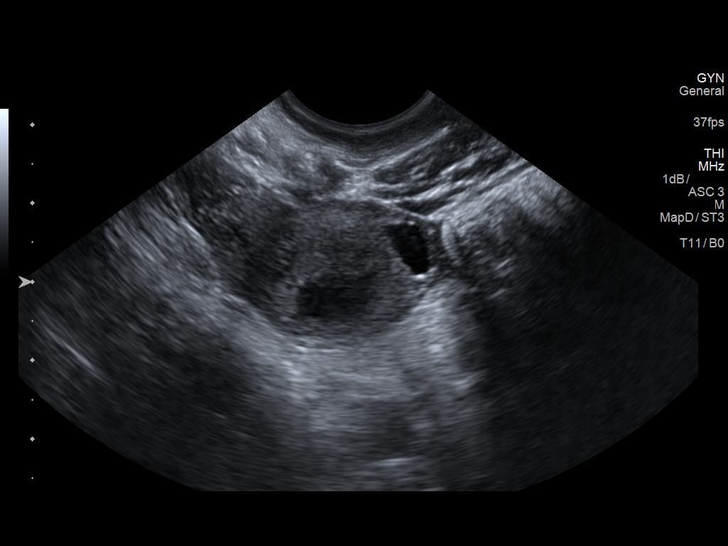
[im 52/57]
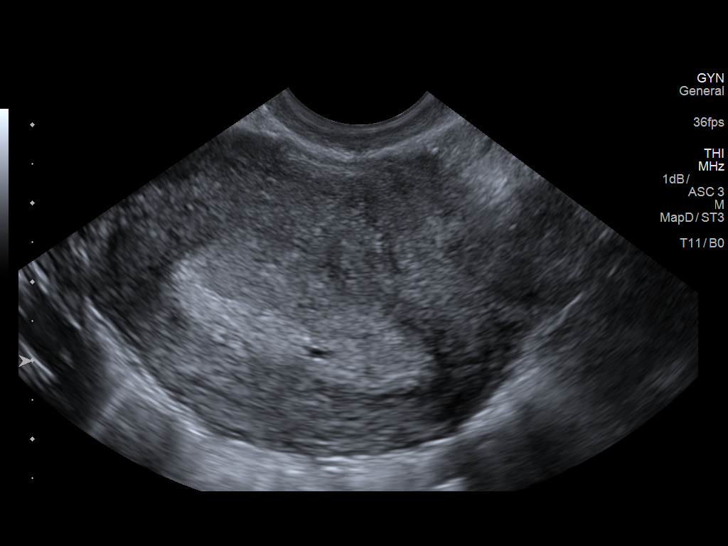
[im 57/57]
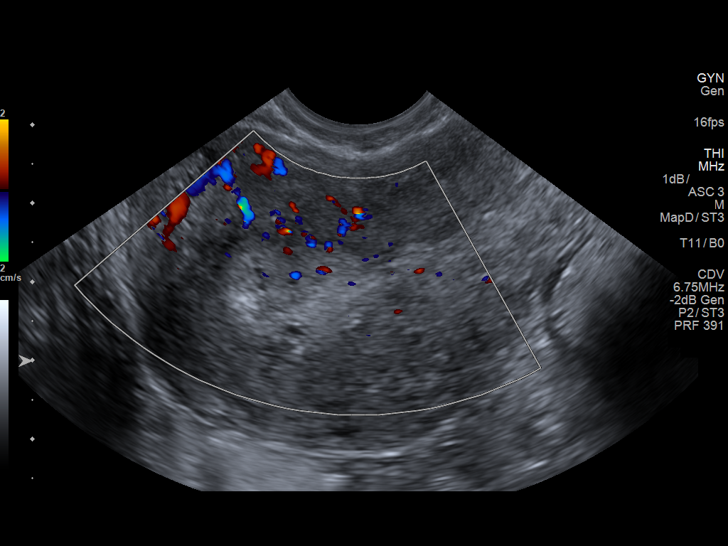

[14 of 25 positions shown; findings below may reference images not displayed]

FINDINGS: Uterus

Measurements: 8.5 x 4.4 x 5.7 cm. No fibroids or other mass
visualized.

Endometrium

Thickness: 10.7 mm. Two tiny cysts in the endometrial cavity, not
felt to be significant.

Right ovary

Measurements: 3.1 x 2.0 x 2.9 cm. Normal appearance/no adnexal mass.

Left ovary

Measurements: 2.1 x 1.7 x 1.8 cm. Normal appearance/no adnexal mass.

Other findings

Trace amount of free fluid, normal for a female of this age.
IMPRESSION: Negative transabdominal and transvaginal pelvic ultrasound.

## 2019-04-05 ENCOUNTER — Other Ambulatory Visit: Payer: Self-pay

## 2019-04-05 ENCOUNTER — Other Ambulatory Visit: Payer: Medicaid Other | Admitting: Adult Health

## 2023-10-25 ENCOUNTER — Other Ambulatory Visit: Payer: Self-pay | Admitting: Medical Genetics

## 2023-11-03 ENCOUNTER — Other Ambulatory Visit

## 2024-01-08 ENCOUNTER — Other Ambulatory Visit: Payer: Self-pay | Admitting: *Deleted

## 2024-01-08 DIAGNOSIS — Z006 Encounter for examination for normal comparison and control in clinical research program: Secondary | ICD-10-CM

## 2024-02-06 LAB — GENECONNECT MOLECULAR SCREEN: Genetic Analysis Overall Interpretation: NEGATIVE
